# Patient Record
Sex: Female | Born: 1965 | Race: White | Hispanic: No | Marital: Married | Smoking: Current some day smoker
Health system: Southern US, Community
[De-identification: ages and names within clinical notes are randomized; demographics above are authoritative.]

## PROBLEM LIST (undated history)

## (undated) DIAGNOSIS — I10 Essential (primary) hypertension: Secondary | ICD-10-CM

## (undated) DIAGNOSIS — M719 Bursopathy, unspecified: Secondary | ICD-10-CM

## (undated) DIAGNOSIS — F32A Depression, unspecified: Secondary | ICD-10-CM

## (undated) DIAGNOSIS — M1990 Unspecified osteoarthritis, unspecified site: Secondary | ICD-10-CM

## (undated) DIAGNOSIS — F419 Anxiety disorder, unspecified: Secondary | ICD-10-CM

## (undated) DIAGNOSIS — M5030 Other cervical disc degeneration, unspecified cervical region: Secondary | ICD-10-CM

## (undated) DIAGNOSIS — F329 Major depressive disorder, single episode, unspecified: Secondary | ICD-10-CM

## (undated) DIAGNOSIS — M797 Fibromyalgia: Secondary | ICD-10-CM

## (undated) HISTORY — PX: ABDOMINAL HYSTERECTOMY: SHX81

## (undated) HISTORY — PX: ABDOMINAL SURGERY: SHX537

---

## 2003-12-22 ENCOUNTER — Emergency Department: Payer: Self-pay | Admitting: Emergency Medicine

## 2004-07-16 ENCOUNTER — Emergency Department: Payer: Self-pay | Admitting: Internal Medicine

## 2007-09-18 ENCOUNTER — Ambulatory Visit: Payer: Self-pay | Admitting: Unknown Physician Specialty

## 2007-10-04 ENCOUNTER — Ambulatory Visit: Payer: Self-pay | Admitting: Unknown Physician Specialty

## 2007-10-08 ENCOUNTER — Ambulatory Visit: Payer: Self-pay | Admitting: Unknown Physician Specialty

## 2007-10-16 ENCOUNTER — Ambulatory Visit: Payer: Self-pay | Admitting: Unknown Physician Specialty

## 2007-10-19 ENCOUNTER — Emergency Department: Payer: Self-pay | Admitting: Internal Medicine

## 2009-01-30 ENCOUNTER — Emergency Department: Payer: Self-pay | Admitting: Emergency Medicine

## 2009-04-16 ENCOUNTER — Ambulatory Visit: Payer: Self-pay | Admitting: Family Medicine

## 2010-02-07 ENCOUNTER — Emergency Department: Payer: Self-pay | Admitting: Unknown Physician Specialty

## 2010-12-06 ENCOUNTER — Encounter: Payer: Self-pay | Admitting: Rheumatology

## 2010-12-23 ENCOUNTER — Encounter: Payer: Self-pay | Admitting: Rheumatology

## 2011-01-22 ENCOUNTER — Encounter: Payer: Self-pay | Admitting: Rheumatology

## 2011-02-22 ENCOUNTER — Encounter: Payer: Self-pay | Admitting: Rheumatology

## 2011-03-25 ENCOUNTER — Encounter: Payer: Self-pay | Admitting: Rheumatology

## 2014-12-14 ENCOUNTER — Encounter: Payer: Self-pay | Admitting: Emergency Medicine

## 2014-12-14 ENCOUNTER — Emergency Department: Payer: Self-pay

## 2014-12-14 ENCOUNTER — Emergency Department
Admission: EM | Admit: 2014-12-14 | Discharge: 2014-12-14 | Disposition: A | Discharge status: Home / Self Care | Payer: Self-pay | Attending: Emergency Medicine | Admitting: Emergency Medicine

## 2014-12-14 DIAGNOSIS — K219 Gastro-esophageal reflux disease without esophagitis: Secondary | ICD-10-CM | POA: Insufficient documentation

## 2014-12-14 DIAGNOSIS — I10 Essential (primary) hypertension: Secondary | ICD-10-CM | POA: Insufficient documentation

## 2014-12-14 DIAGNOSIS — E669 Obesity, unspecified: Secondary | ICD-10-CM | POA: Insufficient documentation

## 2014-12-14 DIAGNOSIS — Z880 Allergy status to penicillin: Secondary | ICD-10-CM | POA: Insufficient documentation

## 2014-12-14 DIAGNOSIS — Z720 Tobacco use: Secondary | ICD-10-CM | POA: Insufficient documentation

## 2014-12-14 DIAGNOSIS — K2960 Other gastritis without bleeding: Secondary | ICD-10-CM

## 2014-12-14 HISTORY — DX: Unspecified osteoarthritis, unspecified site: M19.90

## 2014-12-14 HISTORY — DX: Essential (primary) hypertension: I10

## 2014-12-14 HISTORY — DX: Anxiety disorder, unspecified: F41.9

## 2014-12-14 LAB — COMPREHENSIVE METABOLIC PANEL
ALBUMIN: 4.2 g/dL (ref 3.5–5.0)
ALK PHOS: 107 U/L (ref 38–126)
ALT: 32 U/L (ref 14–54)
AST: 23 U/L (ref 15–41)
Anion gap: 8 (ref 5–15)
BILIRUBIN TOTAL: 0.5 mg/dL (ref 0.3–1.2)
BUN: 13 mg/dL (ref 6–20)
CALCIUM: 9.7 mg/dL (ref 8.9–10.3)
CO2: 28 mmol/L (ref 22–32)
CREATININE: 0.77 mg/dL (ref 0.44–1.00)
Chloride: 106 mmol/L (ref 101–111)
GFR calc Af Amer: >60 mL/min (ref >60)
GFR calc non Af Amer: >60 mL/min (ref >60)
GLUCOSE: 115 mg/dL — AB (ref 65–99)
Potassium: 3.7 mmol/L (ref 3.5–5.1)
SODIUM: 142 mmol/L (ref 135–145)
Total Protein: 7.8 g/dL (ref 6.5–8.1)

## 2014-12-14 LAB — CBC WITH DIFFERENTIAL/PLATELET
BASOS PCT: 1 %
Basophils Absolute: 0.1 10*3/uL (ref 0–0.1)
EOS PCT: 1 %
Eosinophils Absolute: 0.1 10*3/uL (ref 0–0.7)
HEMATOCRIT: 42.1 % (ref 35.0–47.0)
Hemoglobin: 14.3 g/dL (ref 12.0–16.0)
Lymphocytes Relative: 21 %
Lymphs Abs: 1.8 10*3/uL (ref 1.0–3.6)
MCH: 30.5 pg (ref 26.0–34.0)
MCHC: 33.9 g/dL (ref 32.0–36.0)
MCV: 90.1 fL (ref 80.0–100.0)
MONO ABS: 0.6 10*3/uL (ref 0.2–0.9)
MONOS PCT: 7 %
NEUTROS ABS: 6.2 10*3/uL (ref 1.4–6.5)
Neutrophils Relative %: 70 %
PLATELETS: 260 10*3/uL (ref 150–440)
RBC: 4.67 MIL/uL (ref 3.80–5.20)
RDW: 12.6 % (ref 11.5–14.5)
WBC: 8.8 10*3/uL (ref 3.6–11.0)

## 2014-12-14 LAB — LIPASE, BLOOD: Lipase: 18 U/L (ref 11–51)

## 2014-12-14 LAB — TROPONIN I

## 2014-12-14 MED ORDER — PANTOPRAZOLE SODIUM 20 MG PO TBEC: 20.0000 mg | DELAYED_RELEASE_TABLET | Freq: Every day | ORAL | Status: DC

## 2014-12-14 MED ORDER — GI COCKTAIL ~~LOC~~
30.0000 mL | Freq: Once | ORAL | Status: AC
  Administered 2014-12-14: 30 mL via ORAL
  Filled 2014-12-14: qty 30

## 2014-12-14 MED ORDER — CHLORTHALIDONE 25 MG PO TABS: 12.5000 mg | ORAL_TABLET | Freq: Every day | ORAL | Status: DC

## 2014-12-14 NOTE — ED Notes (Signed)
Pt presents with epigastric pain started on Friday, worse after she eats, states has had some vomiting. No acute distress noted, pt ambulatory to stat desk with no difficulty noted.

## 2014-12-14 NOTE — ED Provider Notes (Signed)
Time Seen: Approximately ----------------------------------------- 9:10 AM on 12/14/2014 -----------------------------------------    I have reviewed the triage notes  Chief Complaint: Abdominal Pain   History of Present Illness: Kelsey Malone is a 49 y.o. female who presents with epigastric pain. The patient does provide Fayrene FearingJames and is aware of previous transgender patient. The patient states on Friday they got some pain after eating a meal. Patient states he had some nausea and vomited without any blood or bile. He states the pain seems to have persisted. He does have a history of hypertension and has not been on any medications for several months. Patient denies any history of diabetes or high cholesterol that he is aware of. He does have a history of smoking and anxiety. Patient denies any fever, chills, productive cough. Patient describes the pain as burning and the nausea occurs after meals. Patient denies any abdominal pain in the right upper quadrant or any intrascapular pain.   Past Medical History  Diagnosis Date  . Hypertension   . Arthritis   . Anxiety     There are no active problems to display for this patient.   Past Surgical History  Procedure Laterality Date  . Abdominal surgery    . Abdominal hysterectomy      Past Surgical History  Procedure Laterality Date  . Abdominal surgery    . Abdominal hysterectomy      No current outpatient prescriptions on file.  Allergies:  Penicillins  Family History: No family history on file.  Social History: Social History  Substance Use Topics  . Smoking status: Current Some Day Smoker  . Smokeless tobacco: None  . Alcohol Use: Yes     Review of Systems:   10 point review of systems was performed and was otherwise negative:  Constitutional: No fever Eyes: No visual disturbances ENT: No sore throat, ear pain Cardiac: No chest pain at present Respiratory: No shortness of breath, wheezing, or  stridor Abdomen: No abdominal pain, no vomiting, No diarrhea Endocrine: No weight loss, No night sweats Extremities: No peripheral edema, cyanosis Skin: No rashes, easy bruising Neurologic: No focal weakness, trouble with speech or swollowing Urologic: No dysuria, Hematuria, or urinary frequency   Physical Exam:  ED Triage Vitals  Enc Vitals Group     BP 12/14/14 0842 166/112 mmHg     Pulse Rate 12/14/14 0842 99     Resp 12/14/14 0842 20     Temp 12/14/14 0843 97.8 F (36.6 C)     Temp Source 12/14/14 0843 Oral     SpO2 12/14/14 0842 97 %     Weight --      Height 12/14/14 0839 5\' 6"  (1.676 m)     Head Cir --      Peak Flow --      Pain Score 12/14/14 0840 5     Pain Loc --      Pain Edu? --      Excl. in GC? --     General: Awake , Alert , and Oriented times 3; GCS 15 Head: Normal cephalic , atraumatic Eyes: Pupils equal , round, reactive to light Nose/Throat: No nasal drainage, patent upper airway without erythema or exudate.  Neck: Supple, Full range of motion, No anterior adenopathy or palpable thyroid masses Lungs: Clear to ascultation without wheezes , rhonchi, or rales Heart: Regular rate, regular rhythm without murmurs , gallops , or rubs Abdomen: Obese Soft, non tender without rebound, guarding , or rigidity; bowel sounds positive and  symmetric in all 4 quadrants. No organomegaly .        Extremities: 2 plus symmetric pulses. No edema, clubbing or cyanosis Neurologic: normal ambulation, Motor symmetric without deficits, sensory intact Skin: warm, dry, no rashes No reproducible chest wall pain.  Labs:   All laboratory work was reviewed including any pertinent negatives or positives listed below:  Labs Reviewed  COMPREHENSIVE METABOLIC PANEL  CBC WITH DIFFERENTIAL/PLATELET  LIPASE, BLOOD  TROPONIN I    EKG:  ED ECG REPORT I, Jennye Moccasin, the attending physician, personally viewed and interpreted this ECG.  Date: 12/14/2014 EKG Time: 0842 Rate:  98 Rhythm: normal sinus rhythm QRS Axis: normal Intervals: normal ST/T Wave abnormalities: normal Conduction Disutrbances: none Narrative Interpretation: unremarkable    Radiology:     EXAM: CHEST 2 VIEW  COMPARISON: None.  FINDINGS: Normal heart size and vascularity. No focal pneumonia, collapse or consolidation. Negative for edema, effusion or pneumothorax. Trachea is midline. Mild scoliosis of the spine.  IMPRESSION: No acute chest process.    I personally reviewed the radiologic studies     ED Course:  Patient's stay here was uneventful and the patient remained hemodynamically stable. Patient was given a GI cocktail with symptomatic improvement.Differential includes all life-threatening causes for chest pain. This includes but is not exclusive to acute coronary syndrome, aortic dissection, pulmonary embolism, cardiac tamponade, community-acquired pneumonia, pericarditis, musculoskeletal chest wall pain, etc. Given the patient's current clinical presentation and objective findings I felt most likely this was gastrointestinal in nature. Possible gastritis versus esophageal reflux disease. Patient's also been out of his blood pressure medication he was prescribed this along with prescription for protonix  and advised he can substitute protonix with over-the-counter Nexium or Prilosec. Tylenol for pain and to avoid nonsteroidal products and decrease his smoking.    Assessment:  Gastritis versus esophageal reflux disease     Plan:  Outpatient management Patient was advised to return immediately if condition worsens. Patient was advised to follow up with her primary care physician or other specialized physicians involved and in their current assessment.             Jennye Moccasin, MD 12/14/14 [phone]

## 2014-12-14 NOTE — Discharge Instructions (Signed)
Gastritis, Adult °Gastritis is soreness and swelling (inflammation) of the lining of the stomach. Gastritis can develop as a sudden onset (acute) or long-term (chronic) condition. If gastritis is not treated, it can lead to stomach bleeding and ulcers. °CAUSES  °Gastritis occurs when the stomach lining is weak or damaged. Digestive juices from the stomach then inflame the weakened stomach lining. The stomach lining may be weak or damaged due to viral or bacterial infections. One common bacterial infection is the Helicobacter pylori infection. Gastritis can also result from excessive alcohol consumption, taking certain medicines, or having too much acid in the stomach.  °SYMPTOMS  °In some cases, there are no symptoms. When symptoms are present, they may include: °· Pain or a burning sensation in the upper abdomen. °· Nausea. °· Vomiting. °· An uncomfortable feeling of fullness after eating. °DIAGNOSIS  °Your caregiver may suspect you have gastritis based on your symptoms and a physical exam. To determine the cause of your gastritis, your caregiver may perform the following: °· Blood or stool tests to check for the H pylori bacterium. °· Gastroscopy. A thin, flexible tube (endoscope) is passed down the esophagus and into the stomach. The endoscope has a light and camera on the end. Your caregiver uses the endoscope to view the inside of the stomach. °· Taking a tissue sample (biopsy) from the stomach to examine under a microscope. °TREATMENT  °Depending on the cause of your gastritis, medicines may be prescribed. If you have a bacterial infection, such as an H pylori infection, antibiotics may be given. If your gastritis is caused by too much acid in the stomach, H2 blockers or antacids may be given. Your caregiver may recommend that you stop taking aspirin, ibuprofen, or other nonsteroidal anti-inflammatory drugs (NSAIDs). °HOME CARE INSTRUCTIONS °· Only take over-the-counter or prescription medicines as directed by  your caregiver. °· If you were given antibiotic medicines, take them as directed. Finish them even if you start to feel better. °· Drink enough fluids to keep your urine clear or pale yellow. °· Avoid foods and drinks that make your symptoms worse, such as: °¨ Caffeine or alcoholic drinks. °¨ Chocolate. °¨ Peppermint or mint flavorings. °¨ Garlic and onions. °¨ Spicy foods. °¨ Citrus fruits, such as oranges, lemons, or limes. °¨ Tomato-based foods such as sauce, chili, salsa, and pizza. °¨ Fried and fatty foods. °· Eat small, frequent meals instead of large meals. °SEEK IMMEDIATE MEDICAL CARE IF:  °· You have black or dark red stools. °· You vomit blood or material that looks like coffee grounds. °· You are unable to keep fluids down. °· Your abdominal pain gets worse. °· You have a fever. °· You do not feel better after 1 week. °· You have any other questions or concerns. °MAKE SURE YOU: °· Understand these instructions. °· Will watch your condition. °· Will get help right away if you are not doing well or get worse. °  °This information is not intended to replace advice given to you by your health care provider. Make sure you discuss any questions you have with your health care provider. °  °Document Released: 02/01/2001 Document Revised: 08/09/2011 Document Reviewed: 03/23/2011 °Elsevier Interactive Patient Education ©2016 [employer] ° ° °Please return immediately if condition worsens. Please contact her primary physician or the physician you were given for referral. If you have any specialist physicians involved in her treatment and plan please also contact them. Thank you for using [facility]. ° °

## 2015-04-08 ENCOUNTER — Encounter: Payer: Self-pay | Admitting: *Deleted

## 2015-04-08 ENCOUNTER — Emergency Department
Admission: EM | Admit: 2015-04-08 | Discharge: 2015-04-08 | Disposition: A | Discharge status: Home / Self Care | Payer: Self-pay | Attending: Emergency Medicine | Admitting: Emergency Medicine

## 2015-04-08 DIAGNOSIS — Z880 Allergy status to penicillin: Secondary | ICD-10-CM | POA: Insufficient documentation

## 2015-04-08 DIAGNOSIS — R197 Diarrhea, unspecified: Secondary | ICD-10-CM | POA: Insufficient documentation

## 2015-04-08 DIAGNOSIS — R1110 Vomiting, unspecified: Secondary | ICD-10-CM

## 2015-04-08 DIAGNOSIS — F17200 Nicotine dependence, unspecified, uncomplicated: Secondary | ICD-10-CM | POA: Insufficient documentation

## 2015-04-08 DIAGNOSIS — I10 Essential (primary) hypertension: Secondary | ICD-10-CM | POA: Insufficient documentation

## 2015-04-08 DIAGNOSIS — N390 Urinary tract infection, site not specified: Secondary | ICD-10-CM | POA: Insufficient documentation

## 2015-04-08 DIAGNOSIS — Z79899 Other long term (current) drug therapy: Secondary | ICD-10-CM | POA: Insufficient documentation

## 2015-04-08 DIAGNOSIS — R112 Nausea with vomiting, unspecified: Secondary | ICD-10-CM | POA: Insufficient documentation

## 2015-04-08 LAB — CBC
HEMATOCRIT: 45 % (ref 35.0–47.0)
HEMOGLOBIN: 15.2 g/dL (ref 12.0–16.0)
MCH: 30.3 pg (ref 26.0–34.0)
MCHC: 33.9 g/dL (ref 32.0–36.0)
MCV: 89.6 fL (ref 80.0–100.0)
Platelets: 265 10*3/uL (ref 150–440)
RBC: 5.02 MIL/uL (ref 3.80–5.20)
RDW: 12.8 % (ref 11.5–14.5)
WBC: 18.7 10*3/uL — ABNORMAL HIGH (ref 3.6–11.0)

## 2015-04-08 LAB — COMPREHENSIVE METABOLIC PANEL
ALK PHOS: 94 U/L (ref 38–126)
ALT: 49 U/L (ref 14–54)
ANION GAP: 10 (ref 5–15)
AST: 29 U/L (ref 15–41)
Albumin: 4.7 g/dL (ref 3.5–5.0)
BUN: 22 mg/dL — ABNORMAL HIGH (ref 6–20)
CALCIUM: 9.5 mg/dL (ref 8.9–10.3)
CO2: 28 mmol/L (ref 22–32)
Chloride: 103 mmol/L (ref 101–111)
Creatinine, Ser: 0.79 mg/dL (ref 0.44–1.00)
GLUCOSE: 151 mg/dL — AB (ref 65–99)
Potassium: 3.7 mmol/L (ref 3.5–5.1)
SODIUM: 141 mmol/L (ref 135–145)
Total Bilirubin: 0.9 mg/dL (ref 0.3–1.2)
Total Protein: 8.3 g/dL — ABNORMAL HIGH (ref 6.5–8.1)

## 2015-04-08 LAB — URINALYSIS COMPLETE WITH MICROSCOPIC (ARMC ONLY)
Bilirubin Urine: NEGATIVE
Glucose, UA: NEGATIVE mg/dL
Ketones, ur: NEGATIVE mg/dL
Nitrite: POSITIVE — AB
PROTEIN: 30 mg/dL — AB
Specific Gravity, Urine: 1.028 (ref 1.005–1.030)
pH: 5 (ref 5.0–8.0)

## 2015-04-08 LAB — LIPASE, BLOOD: LIPASE: 14 U/L (ref 11–51)

## 2015-04-08 MED ORDER — ONDANSETRON HCL 4 MG/2ML IJ SOLN
4.0000 mg | Freq: Once | INTRAMUSCULAR | Status: AC
  Administered 2015-04-08: 4 mg via INTRAVENOUS
  Filled 2015-04-08: qty 2

## 2015-04-08 MED ORDER — CIPROFLOXACIN IN D5W 400 MG/200ML IV SOLN
400.0000 mg | Freq: Two times a day (BID) | INTRAVENOUS | Status: DC
  Administered 2015-04-08: 400 mg via INTRAVENOUS
  Filled 2015-04-08: qty 200

## 2015-04-08 MED ORDER — ONDANSETRON 4 MG PO TBDP: 4.0000 mg | ORAL_TABLET | Freq: Three times a day (TID) | ORAL | Status: DC | PRN

## 2015-04-08 MED ORDER — CIPROFLOXACIN HCL 500 MG PO TABS: 500.0000 mg | ORAL_TABLET | Freq: Two times a day (BID) | ORAL | Status: AC

## 2015-04-08 MED ORDER — SODIUM CHLORIDE 0.9 % IV BOLUS (SEPSIS)
1000.0000 mL | Freq: Once | INTRAVENOUS | Status: AC
  Administered 2015-04-08: 1000 mL via INTRAVENOUS

## 2015-04-08 NOTE — Discharge Instructions (Signed)
Diarrhea °Diarrhea is frequent loose and watery bowel movements. It can cause you to feel weak and dehydrated. Dehydration can cause you to become tired and thirsty, have a dry mouth, and have decreased urination that often is dark yellow. Diarrhea is a sign of another problem, most often an infection that will not last long. In most cases, diarrhea typically lasts 2-3 days. However, it can last longer if it is a sign of something more serious. It is important to treat your diarrhea as directed by your caregiver to lessen or prevent future episodes of diarrhea. °CAUSES  °Some common causes include: °· Gastrointestinal infections caused by viruses, bacteria, or parasites. °· Food poisoning or food allergies. °· Certain medicines, such as antibiotics, chemotherapy, and laxatives. °· Artificial sweeteners and fructose. °· Digestive disorders. °HOME CARE INSTRUCTIONS °· Ensure adequate fluid intake (hydration): Have 1 cup (8 oz) of fluid for each diarrhea episode. Avoid fluids that contain simple sugars or sports drinks, fruit juices, whole milk products, and sodas. Your urine should be clear or pale yellow if you are drinking enough fluids. Hydrate with an oral rehydration solution that you can purchase at pharmacies, retail stores, and online. You can prepare an oral rehydration solution at home by mixing the following ingredients together: °·  - tsp table salt. °· ¾ tsp baking soda. °·  tsp salt substitute containing potassium chloride. °· 1  tablespoons sugar. °· 1 L (34 oz) of water. °· Certain foods and beverages may increase the speed at which food moves through the gastrointestinal (GI) tract. These foods and beverages should be avoided and include: °· Caffeinated and alcoholic beverages. °· High-fiber foods, such as raw fruits and vegetables, nuts, seeds, and whole grain breads and cereals. °· Foods and beverages sweetened with sugar alcohols, such as xylitol, sorbitol, and mannitol. °· Some foods may be well  tolerated and may help thicken stool including: °· Starchy foods, such as rice, toast, pasta, low-sugar cereal, oatmeal, grits, baked potatoes, crackers, and bagels. °· Bananas. °· Applesauce. °· Add probiotic-rich foods to help increase healthy bacteria in the GI tract, such as yogurt and fermented milk products. °· Wash your hands well after each diarrhea episode. °· Only take over-the-counter or prescription medicines as directed by your caregiver. °· Take a warm bath to relieve any burning or pain from frequent diarrhea episodes. °SEEK IMMEDIATE MEDICAL CARE IF:  °· You are unable to keep fluids down. °· You have persistent vomiting. °· You have blood in your stool, or your stools are black and tarry. °· You do not urinate in 6-8 hours, or there is only a small amount of very dark urine. °· You have abdominal pain that increases or localizes. °· You have weakness, dizziness, confusion, or light-headedness. °· You have a severe headache. °· Your diarrhea gets worse or does not get better. °· You have a fever or persistent symptoms for more than 2-3 days. °· You have a fever and your symptoms suddenly get worse. °MAKE SURE YOU:  °· Understand these instructions. °· Will watch your condition. °· Will get help right away if you are not doing well or get worse. °  °This information is not intended to replace advice given to you by your health care provider. Make sure you discuss any questions you have with your health care provider. °  °Document Released: 01/28/2002 Document Revised: 02/28/2014 Document Reviewed: 10/16/2011 °Elsevier Interactive Patient Education ©2016 [employer] ° °Nausea and Vomiting °Nausea is a sick feeling that often comes   before throwing up (vomiting). Vomiting is a reflex where stomach contents come out of your mouth. Vomiting can cause severe loss of body fluids (dehydration). Children and elderly adults can become dehydrated quickly, especially if they also have diarrhea. Nausea and  vomiting are symptoms of a condition or disease. It is important to find the cause of your symptoms. °CAUSES  °· Direct irritation of the stomach lining. This irritation can result from increased acid production (gastroesophageal reflux disease), infection, food poisoning, taking certain medicines (such as nonsteroidal anti-inflammatory drugs), alcohol use, or tobacco use. °· Signals from the brain. These signals could be caused by a headache, heat exposure, an inner ear disturbance, increased pressure in the brain from injury, infection, a tumor, or a concussion, pain, emotional stimulus, or metabolic problems. °· An obstruction in the gastrointestinal tract (bowel obstruction). °· Illnesses such as diabetes, hepatitis, gallbladder problems, appendicitis, kidney problems, cancer, sepsis, atypical symptoms of a heart attack, or eating disorders. °· Medical treatments such as chemotherapy and radiation. °· Receiving medicine that makes you sleep (general anesthetic) during surgery. °DIAGNOSIS °Your caregiver may ask for tests to be done if the problems do not improve after a few days. Tests may also be done if symptoms are severe or if the reason for the nausea and vomiting is not clear. Tests may include: °· Urine tests. °· Blood tests. °· Stool tests. °· Cultures (to look for evidence of infection). °· X-rays or other imaging studies. °Test results can help your caregiver make decisions about treatment or the need for additional tests. °TREATMENT °You need to stay well hydrated. Drink frequently but in small amounts. You may wish to drink water, sports drinks, clear broth, or eat frozen ice pops or gelatin dessert to help stay hydrated. When you eat, eating slowly may help prevent nausea. There are also some antinausea medicines that may help prevent nausea. °HOME CARE INSTRUCTIONS  °· Take all medicine as directed by your caregiver. °· If you do not have an appetite, do not force yourself to eat. However, you must  continue to drink fluids. °· If you have an appetite, eat a normal diet unless your caregiver tells you differently. °· Eat a variety of complex carbohydrates (rice, wheat, potatoes, bread), lean meats, yogurt, fruits, and vegetables. °· Avoid high-fat foods because they are more difficult to digest. °· Drink enough water and fluids to keep your urine clear or pale yellow. °· If you are dehydrated, ask your caregiver for specific rehydration instructions. Signs of dehydration may include: °· Severe thirst. °· Dry lips and mouth. °· Dizziness. °· Dark urine. °· Decreasing urine frequency and amount. °· Confusion. °· Rapid breathing or pulse. °SEEK IMMEDIATE MEDICAL CARE IF:  °· You have blood or brown flecks (like coffee grounds) in your vomit. °· You have black or bloody stools. °· You have a severe headache or stiff neck. °· You are confused. °· You have severe abdominal pain. °· You have chest pain or trouble breathing. °· You do not urinate at least once every 8 hours. °· You develop cold or clammy skin. °· You continue to vomit for longer than 24 to 48 hours. °· You have a fever. °MAKE SURE YOU:  °· Understand these instructions. °· Will watch your condition. °· Will get help right away if you are not doing well or get worse. °  °This information is not intended to replace advice given to you by your health care provider. Make sure you discuss any questions you have with your health care   provider. °  °Document Released: 02/07/2005 Document Revised: 05/02/2011 Document Reviewed: 07/07/2010 °Elsevier Interactive Patient Education ©2016 [employer] ° °Urinary Tract Infection °Urinary tract infections (UTIs) can develop anywhere along your urinary tract. Your urinary tract is your body's drainage system for removing wastes and extra water. Your urinary tract includes two kidneys, two ureters, a bladder, and a urethra. Your kidneys are a pair of bean-shaped organs. Each kidney is about the size of your fist. They  are located below your ribs, one on each side of your spine. °CAUSES °Infections are caused by microbes, which are microscopic organisms, including fungi, viruses, and bacteria. These organisms are so small that they can only be seen through a microscope. Bacteria are the microbes that most commonly cause UTIs. °SYMPTOMS  °Symptoms of UTIs may vary by age and gender of the patient and by the location of the infection. Symptoms in young women typically include a frequent and intense urge to urinate and a painful, burning feeling in the bladder or urethra during urination. Older women and men are more likely to be tired, shaky, and weak and have muscle aches and abdominal pain. A fever may mean the infection is in your kidneys. Other symptoms of a kidney infection include pain in your back or sides below the ribs, nausea, and vomiting. °DIAGNOSIS °To diagnose a UTI, your caregiver will ask you about your symptoms. Your caregiver will also ask you to provide a urine sample. The urine sample will be tested for bacteria and white blood cells. White blood cells are made by your body to help fight infection. °TREATMENT  °Typically, UTIs can be treated with medication. Because most UTIs are caused by a bacterial infection, they usually can be treated with the use of antibiotics. The choice of antibiotic and length of treatment depend on your symptoms and the type of bacteria causing your infection. °HOME CARE INSTRUCTIONS °· If you were prescribed antibiotics, take them exactly as your caregiver instructs you. Finish the medication even if you feel better after you have only taken some of the medication. °· Drink enough water and fluids to keep your urine clear or pale yellow. °· Avoid caffeine, tea, and carbonated beverages. They tend to irritate your bladder. °· Empty your bladder often. Avoid holding urine for long periods of time. °· Empty your bladder before and after sexual intercourse. °· After a bowel movement,  women should cleanse from front to back. Use each tissue only once. °SEEK MEDICAL CARE IF:  °· You have back pain. °· You develop a fever. °· Your symptoms do not begin to resolve within 3 days. °SEEK IMMEDIATE MEDICAL CARE IF:  °· You have severe back pain or lower abdominal pain. °· You develop chills. °· You have nausea or vomiting. °· You have continued burning or discomfort with urination. °MAKE SURE YOU:  °· Understand these instructions. °· Will watch your condition. °· Will get help right away if you are not doing well or get worse. °  °This information is not intended to replace advice given to you by your health care provider. Make sure you discuss any questions you have with your health care provider. °  °Document Released: 11/17/2004 Document Revised: 10/29/2014 Document Reviewed: 03/18/2011 °Elsevier Interactive Patient Education ©2016 [employer] ° °

## 2015-04-08 NOTE — ED Provider Notes (Signed)
[facility] Emergency Department Provider Note  ____________________________________________  Time seen: Approximately 356 AM  I have reviewed the triage vital signs and the nursing notes.   HISTORY  Chief Complaint Emesis and Diarrhea    HPI Kelsey Malone is a 50 y.o. female who comes into the hospital today with abdominal pain vomiting and diarrhea. The patient reports that the last time she vomited it was dry heaves. The patient initially thought she had food poisoning but then found out that her son's girlfriend had nor a virus 2 weeks ago. The patient's symptoms started around 8 PM. She reports that she's had pain all over her abdomen is about a 5 out of 10 in intensity. The patient reports laying flat it is not that bad but when she sits up the pain seems worse. The patient's had some cough and chills and her forehead did feel slightly warm. The patient reports that she has back pain but that is a chronic symptom for her. The patient has not had any burning with urination or any other complaints at this time.   Past Medical History  Diagnosis Date  . Hypertension   . Arthritis   . Anxiety     There are no active problems to display for this patient.   Past Surgical History  Procedure Laterality Date  . Abdominal surgery    . Abdominal hysterectomy      Current Outpatient Rx  Name  Route  Sig  Dispense  Refill  . acetaminophen (TYLENOL) 325 MG tablet   Oral   Take 650 mg by mouth every 6 (six) hours as needed for moderate pain, fever or headache.         . chlorthalidone (HYGROTON) 25 MG tablet   Oral   Take 0.5 tablets (12.5 mg total) by mouth daily.   30 tablet   1   . ranitidine (ZANTAC) 150 MG tablet   Oral   Take 150 mg by mouth daily.         . ciprofloxacin (CIPRO) 500 MG tablet   Oral   Take 1 tablet (500 mg total) by mouth 2 (two) times daily.   20 tablet   0   . ondansetron (ZOFRAN ODT) 4 MG disintegrating  tablet   Oral   Take 1 tablet (4 mg total) by mouth every 8 (eight) hours as needed for nausea or vomiting.   20 tablet   0   . pantoprazole (PROTONIX) 20 MG tablet   Oral   Take 1 tablet (20 mg total) by mouth daily. Patient not taking: Reported on 04/08/2015   30 tablet   1     Allergies Opium and Penicillins  No family history on file.  Social History Social History  Substance Use Topics  . Smoking status: Current Some Day Smoker  . Smokeless tobacco: None  . Alcohol Use: Yes    Review of Systems Constitutional: Chills Eyes: No visual changes. ENT: No sore throat. Cardiovascular: Denies chest pain. Respiratory: Denies shortness of breath. Gastrointestinal:  abdominal pain, nausea, vomiting, diarrhea.  No constipation. Genitourinary: Negative for dysuria. Musculoskeletal: Negative for back pain. Skin: Negative for rash. Neurological: Negative for headaches, focal weakness or numbness.  10-point ROS otherwise negative.  ____________________________________________   PHYSICAL EXAM:  VITAL SIGNS: ED Triage Vitals  Enc Vitals Group     BP 04/08/15 0206 122/79 mmHg     Pulse Rate 04/08/15 0206 118     Resp 04/08/15 0206 20  Temp 04/08/15 0206 97.7 F (36.5 C)     Temp Source 04/08/15 0206 Oral     SpO2 04/08/15 0206 100 %     Weight 04/08/15 0206 250 lb (113.399 kg)     Height 04/08/15 0206  (1.702 m)     Head Cir --      Peak Flow --      Pain Score 04/08/15 0207 8     Pain Loc --      Pain Edu? --      Excl. in GC? --     Constitutional: Alert and oriented. Well appearing and in mild distress. Eyes: Conjunctivae are normal. PERRL. EOMI. Head: Atraumatic. Nose: No congestion/rhinnorhea. Mouth/Throat: Mucous membranes are moist.  Oropharynx non-erythematous. Cardiovascular: Normal rate, regular rhythm. Grossly normal heart sounds.  Good peripheral circulation. Respiratory: Normal respiratory effort.  No retractions. Lungs  CTAB. Gastrointestinal: Soft with diffuse abdominal pain. No distention. Positive bowel sounds Musculoskeletal: No lower extremity tenderness nor edema.   Neurologic:  Normal speech and language. Skin:  Skin is warm, dry and intact. Marland Kitchen Psychiatric: Mood and affect are normal.   ____________________________________________   LABS (all labs ordered are listed, but only abnormal results are displayed)  Labs Reviewed  COMPREHENSIVE METABOLIC PANEL - Abnormal; Notable for the following:    Glucose, Bld 151 (*)    BUN 22 (*)    Total Protein 8.3 (*)    All other components within normal limits  CBC - Abnormal; Notable for the following:    WBC 18.7 (*)    All other components within normal limits  URINALYSIS COMPLETEWITH MICROSCOPIC ([facility] ONLY) - Abnormal; Notable for the following:    Color, Urine AMBER (*)    APPearance CLOUDY (*)    Hgb urine dipstick 1+ (*)    Protein, ur 30 (*)    Nitrite POSITIVE (*)    Leukocytes, UA TRACE (*)    Bacteria, UA MANY (*)    Squamous Epithelial / LPF 6-30 (*)    All other components within normal limits  URINE CULTURE  LIPASE, BLOOD   ____________________________________________  EKG  None ____________________________________________  RADIOLOGY  None ____________________________________________   PROCEDURES  Procedure(s) performed: None  Critical Care performed: No  ____________________________________________   INITIAL IMPRESSION / ASSESSMENT AND PLAN / ED COURSE  Pertinent labs & imaging results that were available during my care of the patient were reviewed by me and considered in my medical decision making (see chart for details).  This is a 49 year old transgender female who comes into the hospital today with abdominal pain vomiting and diarrhea. The patient has had a contact with normal iris but does also appear to have a UTI. I did give the patient liter of normal saline as well as a dose of ciprofloxacin as she is  allergic to penicillins. The patient has had some tongue and throat swelling with the penicillins I did not think that ceftriaxone would be appropriate. After giving some oral Zofran the patient was able to take some ice chips as well as drink some juice. She will be discharged home with some Zofran as well as some antibiotics. I did send a urine culture as well. ____________________________________________   FINAL CLINICAL IMPRESSION(S) / ED DIAGNOSES  Final diagnoses:  Vomiting and diarrhea  UTI (lower urinary tract infection)      Rebecka Apley, MD 04/08/15 [phone]

## 2015-04-08 NOTE — ED Notes (Addendum)
Pt to triage via wheelchair.  Pt has vomiting and diarrhea after eating out of date bread tonight.  Pt reports other people got sick too after eating the bread.  Vomited x 3 tonight.  Pt alert.

## 2015-04-11 LAB — URINE CULTURE

## 2015-09-12 ENCOUNTER — Emergency Department: Payer: Self-pay

## 2015-09-12 ENCOUNTER — Emergency Department
Admission: EM | Admit: 2015-09-12 | Discharge: 2015-09-12 | Disposition: A | Discharge status: Home / Self Care | Payer: Self-pay | Attending: Emergency Medicine | Admitting: Emergency Medicine

## 2015-09-12 DIAGNOSIS — R110 Nausea: Secondary | ICD-10-CM

## 2015-09-12 DIAGNOSIS — F17200 Nicotine dependence, unspecified, uncomplicated: Secondary | ICD-10-CM | POA: Insufficient documentation

## 2015-09-12 DIAGNOSIS — I10 Essential (primary) hypertension: Secondary | ICD-10-CM | POA: Insufficient documentation

## 2015-09-12 DIAGNOSIS — R42 Dizziness and giddiness: Secondary | ICD-10-CM | POA: Insufficient documentation

## 2015-09-12 LAB — CBC WITH DIFFERENTIAL/PLATELET
BASOS ABS: 0.1 10*3/uL (ref 0–0.1)
Basophils Relative: 1 %
Eosinophils Absolute: 0.1 10*3/uL (ref 0–0.7)
Eosinophils Relative: 2 %
HEMATOCRIT: 41 % (ref 35.0–47.0)
Hemoglobin: 14.2 g/dL (ref 12.0–16.0)
LYMPHS ABS: 2.5 10*3/uL (ref 1.0–3.6)
LYMPHS PCT: 32 %
MCH: 31.1 pg (ref 26.0–34.0)
MCHC: 34.6 g/dL (ref 32.0–36.0)
MCV: 89.7 fL (ref 80.0–100.0)
MONO ABS: 0.6 10*3/uL (ref 0.2–0.9)
Monocytes Relative: 8 %
NEUTROS ABS: 4.4 10*3/uL (ref 1.4–6.5)
Neutrophils Relative %: 57 %
Platelets: 244 10*3/uL (ref 150–440)
RBC: 4.57 MIL/uL (ref 3.80–5.20)
RDW: 12.5 % (ref 11.5–14.5)
WBC: 7.7 10*3/uL (ref 3.6–11.0)

## 2015-09-12 LAB — COMPREHENSIVE METABOLIC PANEL
ALT: 42 U/L (ref 14–54)
ANION GAP: 9 (ref 5–15)
AST: 27 U/L (ref 15–41)
Albumin: 4 g/dL (ref 3.5–5.0)
Alkaline Phosphatase: 84 U/L (ref 38–126)
BUN: 13 mg/dL (ref 6–20)
CALCIUM: 9.1 mg/dL (ref 8.9–10.3)
CHLORIDE: 108 mmol/L (ref 101–111)
CO2: 22 mmol/L (ref 22–32)
CREATININE: 0.68 mg/dL (ref 0.44–1.00)
GFR calc non Af Amer: >60 mL/min (ref >60)
GLUCOSE: 129 mg/dL — AB (ref 65–99)
Potassium: 3.5 mmol/L (ref 3.5–5.1)
SODIUM: 139 mmol/L (ref 135–145)
Total Bilirubin: 0.7 mg/dL (ref 0.3–1.2)
Total Protein: 7.5 g/dL (ref 6.5–8.1)

## 2015-09-12 LAB — SEDIMENTATION RATE: SED RATE: 24 mm/h (ref 0–30)

## 2015-09-12 LAB — BRAIN NATRIURETIC PEPTIDE: B Natriuretic Peptide: 22 pg/mL (ref 0.0–100.0)

## 2015-09-12 LAB — CK: Total CK: 58 U/L (ref 38–234)

## 2015-09-12 LAB — TROPONIN I

## 2015-09-12 MED ORDER — MECLIZINE HCL 25 MG PO TABS
ORAL_TABLET | ORAL | Status: AC
  Administered 2015-09-12: 50 mg via ORAL
  Filled 2015-09-12: qty 2

## 2015-09-12 MED ORDER — ONDANSETRON HCL 4 MG/2ML IJ SOLN: 4.0000 mg | Freq: Once | INTRAMUSCULAR | Status: DC | PRN

## 2015-09-12 MED ORDER — ONDANSETRON 4 MG PO TBDP: 4.0000 mg | ORAL_TABLET | Freq: Once | ORAL | Status: DC

## 2015-09-12 MED ORDER — ONDANSETRON 4 MG PO TBDP: 4.0000 mg | ORAL_TABLET | Freq: Three times a day (TID) | ORAL | Status: DC | PRN

## 2015-09-12 MED ORDER — MECLIZINE HCL 32 MG PO TABS: 32.0000 mg | ORAL_TABLET | Freq: Three times a day (TID) | ORAL | Status: DC | PRN

## 2015-09-12 MED ORDER — MECLIZINE HCL 25 MG PO TABS
50.0000 mg | ORAL_TABLET | Freq: Once | ORAL | Status: AC
  Administered 2015-09-12: 50 mg via ORAL

## 2015-09-12 MED ORDER — ONDANSETRON HCL 4 MG/2ML IJ SOLN
4.0000 mg | Freq: Once | INTRAMUSCULAR | Status: AC | PRN
  Administered 2015-09-12: 4 mg via INTRAVENOUS
  Filled 2015-09-12: qty 2

## 2015-09-12 NOTE — ED Provider Notes (Signed)
[facility] Emergency Department Provider Note   ____________________________________________  Time seen: Approximately 8:28 AM  I have reviewed the triage vital signs and the nursing notes.   HISTORY  Chief Complaint Nausea    HPI Kelsey Malone is a 50 y.o. female who was stung by yellow jackets 4 days ago. At least 5 or 6 stings. Still feels nauseated and just doesn't feel well. No chest pain or shortness of breath or swelling or anything doesn't feel well. Patient says not taking her antihypertensive medicine because he had money to go see the doctor. They have a list of free and reduced cost clinics in the area.   Past Medical History  Diagnosis Date  . Hypertension   . Arthritis   . Anxiety     There are no active problems to display for this patient.   Past Surgical History  Procedure Laterality Date  . Abdominal surgery    . Abdominal hysterectomy      Current Outpatient Rx  Name  Route  Sig  Dispense  Refill  . acetaminophen (TYLENOL) 325 MG tablet   Oral   Take 650 mg by mouth every 6 (six) hours as needed for moderate pain, fever or headache.         . chlorthalidone (HYGROTON) 25 MG tablet   Oral   Take 0.5 tablets (12.5 mg total) by mouth daily.   30 tablet   1   . meclizine (ANTIVERT) 32 MG tablet   Oral   Take 1 tablet (32 mg total) by mouth 3 (three) times daily as needed.   30 tablet   0   . ondansetron (ZOFRAN ODT) 4 MG disintegrating tablet   Oral   Take 1 tablet (4 mg total) by mouth every 8 (eight) hours as needed for nausea or vomiting.   20 tablet   0   . ondansetron (ZOFRAN ODT) 4 MG disintegrating tablet   Oral   Take 1 tablet (4 mg total) by mouth every 8 (eight) hours as needed for nausea or vomiting.   9 tablet   0   . ondansetron (ZOFRAN) 4 MG/2ML SOLN injection   Intravenous   Inject 2 mLs (4 mg total) into the vein once as needed for nausea.   2 mL   0   . ondansetron (ZOFRAN-ODT) 4  MG disintegrating tablet   Oral   Take 1 tablet (4 mg total) by mouth once.   20 tablet   0   . pantoprazole (PROTONIX) 20 MG tablet   Oral   Take 1 tablet (20 mg total) by mouth daily. Patient not taking: Reported on 04/08/2015   30 tablet   1   . ranitidine (ZANTAC) 150 MG tablet   Oral   Take 150 mg by mouth daily.           Allergies Opium and Penicillins  No family history on file.  Social History Social History  Substance Use Topics  . Smoking status: Current Some Day Smoker  . Smokeless tobacco: Not on file  . Alcohol Use: Yes    Review of Systems Constitutional: No fever/chills Eyes: No visual changes. ENT: No sore throat. Cardiovascular: Denies chest pain. Respiratory: Denies shortness of breath. Gastrointestinal: No abdominal pain.  No nausea, no vomiting.  No diarrhea.  No constipation. Genitourinary: Negative for dysuria. Musculoskeletal: Negative for back pain. Skin: Negative for rash. Neurological: Negative for headaches, focal weakness or numbness.  10-point ROS otherwise negative.  ____________________________________________   PHYSICAL EXAM:  VITAL SIGNS: ED Triage Vitals  Enc Vitals Group     BP 09/12/15 0715 142/108 mmHg     Pulse Rate 09/12/15 0715 86     Resp 09/12/15 0715 20     Temp 09/12/15 0715 98.6 F (37 C)     Temp Source 09/12/15 0715 Oral     SpO2 09/12/15 0715 98 %     Weight 09/12/15 0715 250 lb (113.399 kg)     Height 09/12/15 0715  (1.676 m)     Head Cir --      Peak Flow --      Pain Score 09/12/15 0716 7     Pain Loc --      Pain Edu? --      Excl. in GC? --     Constitutional: Alert and oriented. Well appearing and in no acute distress. Eyes: Conjunctivae are normal. PERRL. EOMI. Head: Atraumatic. Nose: No congestion/rhinnorhea. Mouth/Throat: Mucous membranes are moist.  Oropharynx non-erythematous. Neck: No stridor.   Cardiovascular: Normal rate, regular rhythm. Grossly normal heart sounds.  Good  peripheral circulation. Respiratory: Normal respiratory effort.  No retractions. Lungs CTAB. Gastrointestinal: Soft and nontender. No distention. No abdominal bruits. No CVA tenderness. Musculoskeletal: No lower extremity tenderness nor edema.  No joint effusions. Neurologic:  Normal speech and language. No gross focal neurologic deficits are appreciated. . Skin:  Skin is warm, dry and intact. No rash noted.   ____________________________________________   LABS (all labs ordered are listed, but only abnormal results are displayed)  Labs Reviewed  COMPREHENSIVE METABOLIC PANEL - Abnormal; Notable for the following:    Glucose, Bld 129 (*)    All other components within normal limits  BRAIN NATRIURETIC PEPTIDE  TROPONIN I  CBC WITH DIFFERENTIAL/PLATELET  CK  SEDIMENTATION RATE   ____________________________________________  EKG  EKG read and interpreted by me shows normal sinus rhythm rate of 83 normal axis no acute ST-T wave changes ____________________________________________  RADIOLOGY  CLINICAL DATA: Bee stings last week. Vomiting and nausea. Weakness.  EXAM: PORTABLE CHEST 1 VIEW  COMPARISON: 12/14/2014  FINDINGS: Two AP views of the chest. Midline trachea. Borderline cardiomegaly. No pleural effusion or pneumothorax. Clear lungs.  IMPRESSION: Borderline cardiomegaly, without acute disease.   Electronically Signed  By: Jeronimo Greaves M.D.  On: 09/12/2015 07:49 ____________________________________________   PROCEDURES    Procedures    ____________________________________________   INITIAL IMPRESSION / ASSESSMENT AND PLAN / ED COURSE  Pertinent labs & imaging results that were available during my care of the patient were reviewed by me and considered in my medical decision making (see chart for details).  Patient reports some vertigo. She says she's had this before for quite some time. She said it initially started after she was slammed  onto brick wall by. She says it usually gets worse when she is sick. She has spinning when she moves her head. Started again today. On exam thrust testing indicates a peripheral lesion. She also has latency and some fatigability. There is a small amount of nystagmus. Her exam is otherwise normal. Fundi look essentially normal cranial nerves II through XII intact cerebellar rapid alternating movements in the hands and finger to nose is equal bilaterally. Motor strength is 5 over 5 throughout. Sensation is intact. ____________________________________________   FINAL CLINICAL IMPRESSION(S) / ED DIAGNOSES  Final diagnoses:  Nausea  Vertigo      NEW MEDICATIONS STARTED DURING THIS VISIT:  Discharge Medication List as of 09/12/2015 10:38  AM    START taking these medications   Details  meclizine (ANTIVERT) 32 MG tablet Take 1 tablet (32 mg total) by mouth 3 (three) times daily as needed., Starting 09/12/2015, Until Discontinued, Print    !! ondansetron (ZOFRAN ODT) 4 MG disintegrating tablet Take 1 tablet (4 mg total) by mouth every 8 (eight) hours as needed for nausea or vomiting., Starting 09/12/2015, Until Discontinued, Print    ondansetron (ZOFRAN) 4 MG/2ML SOLN injection Inject 2 mLs (4 mg total) into the vein once as needed for nausea., Starting 09/12/2015, Until Discontinued, Print    !! ondansetron (ZOFRAN-ODT) 4 MG disintegrating tablet Take 1 tablet (4 mg total) by mouth once., Starting 09/12/2015, Print     !! - Potential duplicate medications found. Please discuss with provider.       Note:  This document was prepared using Dragon voice recognition software and may include unintentional dictation errors.    Arnaldo Natal, MD 09/12/15 [phone]

## 2015-09-12 NOTE — ED Notes (Signed)
Nausea better but still feels like room spinning at times.

## 2015-09-12 NOTE — ED Notes (Signed)
Dizziness improved after meclizine. Dr Darnelle Catalan notified.

## 2015-09-12 NOTE — ED Notes (Signed)
Pt states that a yellow jacket nest was disturbed causing the pt to get  10-12 stings. Pt took benadryl for the first 2 days, pt states nausea cont and had some vomiting this am, pt reports weakness and just not feeling well, denies rash or syncope

## 2016-04-26 DIAGNOSIS — K529 Noninfective gastroenteritis and colitis, unspecified: Secondary | ICD-10-CM | POA: Insufficient documentation

## 2016-04-26 DIAGNOSIS — F17210 Nicotine dependence, cigarettes, uncomplicated: Secondary | ICD-10-CM | POA: Insufficient documentation

## 2016-04-26 DIAGNOSIS — I10 Essential (primary) hypertension: Secondary | ICD-10-CM | POA: Insufficient documentation

## 2016-04-26 DIAGNOSIS — Z79899 Other long term (current) drug therapy: Secondary | ICD-10-CM | POA: Insufficient documentation

## 2016-04-26 LAB — URINALYSIS, COMPLETE (UACMP) WITH MICROSCOPIC
BILIRUBIN URINE: NEGATIVE
Glucose, UA: NEGATIVE mg/dL
HGB URINE DIPSTICK: NEGATIVE
KETONES UR: 5 mg/dL — AB
Nitrite: NEGATIVE
Protein, ur: 100 mg/dL — AB
Specific Gravity, Urine: 1.028 (ref 1.005–1.030)
pH: 7 (ref 5.0–8.0)

## 2016-04-26 LAB — CBC
HEMATOCRIT: 43 % (ref 35.0–47.0)
HEMOGLOBIN: 14.8 g/dL (ref 12.0–16.0)
MCH: 30.3 pg (ref 26.0–34.0)
MCHC: 34.3 g/dL (ref 32.0–36.0)
MCV: 88.4 fL (ref 80.0–100.0)
Platelets: 267 10*3/uL (ref 150–440)
RBC: 4.87 MIL/uL (ref 3.80–5.20)
RDW: 12.4 % (ref 11.5–14.5)
WBC: 9.7 10*3/uL (ref 3.6–11.0)

## 2016-04-26 LAB — COMPREHENSIVE METABOLIC PANEL
ALT: 38 U/L (ref 14–54)
ANION GAP: 10 (ref 5–15)
AST: 28 U/L (ref 15–41)
Albumin: 4.4 g/dL (ref 3.5–5.0)
Alkaline Phosphatase: 84 U/L (ref 38–126)
BUN: 11 mg/dL (ref 6–20)
CHLORIDE: 105 mmol/L (ref 101–111)
CO2: 24 mmol/L (ref 22–32)
Calcium: 9.8 mg/dL (ref 8.9–10.3)
Creatinine, Ser: 0.6 mg/dL (ref 0.44–1.00)
Glucose, Bld: 120 mg/dL — ABNORMAL HIGH (ref 65–99)
POTASSIUM: 3.6 mmol/L (ref 3.5–5.1)
Sodium: 139 mmol/L (ref 135–145)
Total Bilirubin: 0.7 mg/dL (ref 0.3–1.2)
Total Protein: 7.9 g/dL (ref 6.5–8.1)

## 2016-04-26 LAB — LIPASE, BLOOD

## 2016-04-26 MED ORDER — ONDANSETRON 4 MG PO TBDP
4.0000 mg | ORAL_TABLET | Freq: Once | ORAL | Status: AC | PRN
  Administered 2016-04-26: 4 mg via ORAL
  Filled 2016-04-26: qty 1

## 2016-04-26 NOTE — ED Triage Notes (Signed)
Pt reports to ED w/ c/o n/v/d that began yesterday.  Pt denies abd pain, CP, SOB, fever or LOC.  Pt alert and oriented, resp even and unlabored. Pt reports not being able to keep down fluids.

## 2016-04-27 ENCOUNTER — Emergency Department
Admission: EM | Admit: 2016-04-27 | Discharge: 2016-04-27 | Disposition: A | Discharge status: Home / Self Care | Payer: Self-pay | Attending: Emergency Medicine | Admitting: Emergency Medicine

## 2016-04-27 DIAGNOSIS — K529 Noninfective gastroenteritis and colitis, unspecified: Secondary | ICD-10-CM

## 2016-04-27 DIAGNOSIS — R112 Nausea with vomiting, unspecified: Secondary | ICD-10-CM

## 2016-04-27 DIAGNOSIS — R197 Diarrhea, unspecified: Secondary | ICD-10-CM

## 2016-04-27 HISTORY — DX: Other cervical disc degeneration, unspecified cervical region: M50.30

## 2016-04-27 HISTORY — DX: Major depressive disorder, single episode, unspecified: F32.9

## 2016-04-27 HISTORY — DX: Depression, unspecified: F32.A

## 2016-04-27 HISTORY — DX: Fibromyalgia: M79.7

## 2016-04-27 MED ORDER — ONDANSETRON HCL 4 MG/2ML IJ SOLN
4.0000 mg | Freq: Once | INTRAMUSCULAR | Status: AC
  Administered 2016-04-27: 4 mg via INTRAVENOUS
  Filled 2016-04-27: qty 2

## 2016-04-27 MED ORDER — SODIUM CHLORIDE 0.9 % IV BOLUS (SEPSIS)
1000.0000 mL | Freq: Once | INTRAVENOUS | Status: AC
  Administered 2016-04-27: 1000 mL via INTRAVENOUS

## 2016-04-27 MED ORDER — ONDANSETRON 4 MG PO TBDP: 4.0000 mg | ORAL_TABLET | Freq: Three times a day (TID) | ORAL | 0 refills | Status: DC | PRN

## 2016-04-27 NOTE — ED Notes (Signed)
Pt was given PO fluids and crackers/peanut butter. Pt able to eat and drink without difficulty.

## 2016-04-27 NOTE — ED Provider Notes (Signed)
[facility] Emergency Department Provider Note   ____________________________________________   First MD Initiated Contact with Patient 04/27/16 [phone]     (approximate)  I have reviewed the triage vital signs and the nursing notes.   HISTORY  Chief Complaint Emesis and Nausea    HPI Kelsey Malone is a 51 y.o. female who comes into the hospital today with vomiting. The patient reports it started yesterday. She thought maybe her gastritis was acting up and took some Maalox. The patient went to bed but woke up this morning and the vomiting resumed. The patient started having some diarrhea as well. She's had a cold recently and thought that it could've been from her drainage. The patient has vomited somewhere between 6 and 8 times with 3 episodes of diarrhea. Her emesis looked like whatever she tried to eat as she's been unable to keep anything down. The patient did receive nausea medicine in triage and hasn't vomited since then. She denies any abdominal pain outside of the ordinary. She reports that she'll have some cramping before she vomits and then soreness afterwards. The patient came into the hospital as she is concerned she may be dehydrated and she has not been able to keep any fluids down.   Past Medical History:  Diagnosis Date  . Anxiety   . Arthritis   . DDD (degenerative disc disease), cervical   . Depression   . Fibromyalgia   . Hypertension     There are no active problems to display for this patient.   Past Surgical History:  Procedure Laterality Date  . ABDOMINAL HYSTERECTOMY    . ABDOMINAL SURGERY    . CESAREAN SECTION      Prior to Admission medications   Medication Sig Start Date End Date Taking? Authorizing Provider  acetaminophen (TYLENOL) 325 MG tablet Take 650 mg by mouth every 6 (six) hours as needed for moderate pain, fever or headache.    Historical Provider, MD  chlorthalidone (HYGROTON) 25 MG tablet Take 0.5 tablets  (12.5 mg total) by mouth daily. 12/14/14   Jennye MoccasinBrian S Quigley, MD  meclizine (ANTIVERT) 32 MG tablet Take 1 tablet (32 mg total) by mouth 3 (three) times daily as needed. 09/12/15   Arnaldo NatalPaul F Malinda, MD  ondansetron (ZOFRAN ODT) 4 MG disintegrating tablet Take 1 tablet (4 mg total) by mouth every 8 (eight) hours as needed for nausea or vomiting. 04/08/15   Rebecka ApleyAllison P Kaycee Mcgaugh, MD  ondansetron (ZOFRAN ODT) 4 MG disintegrating tablet Take 1 tablet (4 mg total) by mouth every 8 (eight) hours as needed for nausea or vomiting. 09/12/15   Arnaldo NatalPaul F Malinda, MD  ondansetron (ZOFRAN ODT) 4 MG disintegrating tablet Take 1 tablet (4 mg total) by mouth every 8 (eight) hours as needed for nausea or vomiting. 04/27/16   Rebecka ApleyAllison P Oluwaseyi Raffel, MD  ondansetron [facility](ZOFRAN) 4 MG/2ML SOLN injection Inject 2 mLs (4 mg total) into the vein once as needed for nausea. 09/12/15   Arnaldo NatalPaul F Malinda, MD  ondansetron (ZOFRAN-ODT) 4 MG disintegrating tablet Take 1 tablet (4 mg total) by mouth once. 09/12/15   Arnaldo NatalPaul F Malinda, MD  pantoprazole (PROTONIX) 20 MG tablet Take 1 tablet (20 mg total) by mouth daily. Patient not taking: Reported on 04/08/2015 12/14/14 12/14/15  Jennye MoccasinBrian S Quigley, MD  ranitidine (ZANTAC) 150 MG tablet Take 150 mg by mouth daily.    Historical Provider, MD    Allergies Opium and Penicillins  No family history on file.  Social History  Social History  Substance Use Topics  . Smoking status: Current Some Day Smoker    Packs/day: 0.50    Types: Cigarettes  . Smokeless tobacco: Never Used  . Alcohol use Yes    Review of Systems Constitutional: No fever/chills Eyes: No visual changes. ENT: No sore throat. Cardiovascular: Denies chest pain. Respiratory: Denies shortness of breath. Gastrointestinal: Nausea, vomiting, diarrhea No abdominal pain. No constipation. Genitourinary: Negative for dysuria. Musculoskeletal: Negative for back pain. Skin: Negative for rash. Neurological: Negative for headaches, focal weakness or  numbness.  10-point ROS otherwise negative.  ____________________________________________   PHYSICAL EXAM:  VITAL SIGNS: ED Triage Vitals [04/26/16 2142]  Enc Vitals Group     BP (!) 153/97     Pulse Rate 85     Resp 20     Temp 98.1 F (36.7 C)     Temp Source Oral     SpO2 97 %     Weight 250 lb (113.4 kg)     Height 5\' 6"  (1.676 m)     Head Circumference      Peak Flow      Pain Score 0     Pain Loc      Pain Edu?      Excl. in GC?     Constitutional: Alert and oriented. Well appearing and in Mild distress. Eyes: Conjunctivae are normal. PERRL. EOMI. Head: Atraumatic. Nose: No congestion/rhinnorhea. Mouth/Throat: Mucous membranes are moist.  Oropharynx non-erythematous. Cardiovascular: Normal rate, regular rhythm. Grossly normal heart sounds.  Good peripheral circulation. Respiratory: Normal respiratory effort.  No retractions. Lungs CTAB. Gastrointestinal: Soft and nontender. No distention. Positive bowel sounds Musculoskeletal: No lower extremity tenderness nor edema.   Neurologic:  Normal speech and language.  Skin:  Skin is warm, dry and intact.  Psychiatric: Mood and affect are normal.   ____________________________________________   LABS (all labs ordered are listed, but only abnormal results are displayed)  Labs Reviewed  LIPASE, BLOOD - Abnormal; Notable for the following:       Result Value   Lipase <10 (*)    All other components within normal limits  COMPREHENSIVE METABOLIC PANEL - Abnormal; Notable for the following:    Glucose, Bld 120 (*)    All other components within normal limits  URINALYSIS, COMPLETE (UACMP) WITH MICROSCOPIC - Abnormal; Notable for the following:    Color, Urine AMBER (*)    APPearance HAZY (*)    Ketones, ur 5 (*)    Protein, ur 100 (*)    Leukocytes, UA TRACE (*)    Bacteria, UA RARE (*)    Squamous Epithelial / LPF 6-30 (*)    All other components within normal limits  CBC    ____________________________________________  EKG  none ____________________________________________  RADIOLOGY  none ____________________________________________   PROCEDURES  Procedure(s) performed: None  Procedures  Critical Care performed: No  ____________________________________________   INITIAL IMPRESSION / ASSESSMENT AND PLAN / ED COURSE  Pertinent labs & imaging results that were available during my care of the patient were reviewed by me and considered in my medical decision making (see chart for details).  This is a 51 year old who comes into the hospital today with some vomiting and diarrhea. The patient has not had any fever and reports that she is concerned about dehydration. The patient had some blood work that was unremarkable. She doesn't have any abdominal pain. I will give the patient a liter of normal saline and I will give her some more Zofran. I will  have the patient take some by mouth and if she is able to keep fluids down she'll be discharged home. The patient likely has some gastroenteritis causing her symptoms.     The patient feels improved and will be discharged home. She was able to take liquids by mouth without any vomiting. ____________________________________________   FINAL CLINICAL IMPRESSION(S) / ED DIAGNOSES  Final diagnoses:  Nausea vomiting and diarrhea  Gastroenteritis      NEW MEDICATIONS STARTED DURING THIS VISIT:  New Prescriptions   ONDANSETRON (ZOFRAN ODT) 4 MG DISINTEGRATING TABLET    Take 1 tablet (4 mg total) by mouth every 8 (eight) hours as needed for nausea or vomiting.     Note:  This document was prepared using Dragon voice recognition software and may include unintentional dictation errors.    Rebecka Apley, MD 04/27/16 [phone]

## 2016-04-27 NOTE — ED Notes (Signed)
MD Zenda AlpersWebster at bedside at this time,.

## 2017-05-08 IMAGING — CR DG CHEST 2V
2 series · 2 of 2 positions shown · non-contrast
Comparison: None.

CLINICAL DATA: Acute chest pain since [REDACTED], hypertension and
anxiety. Smoker.

EXAM:
CHEST  2 VIEW

[chest pa]
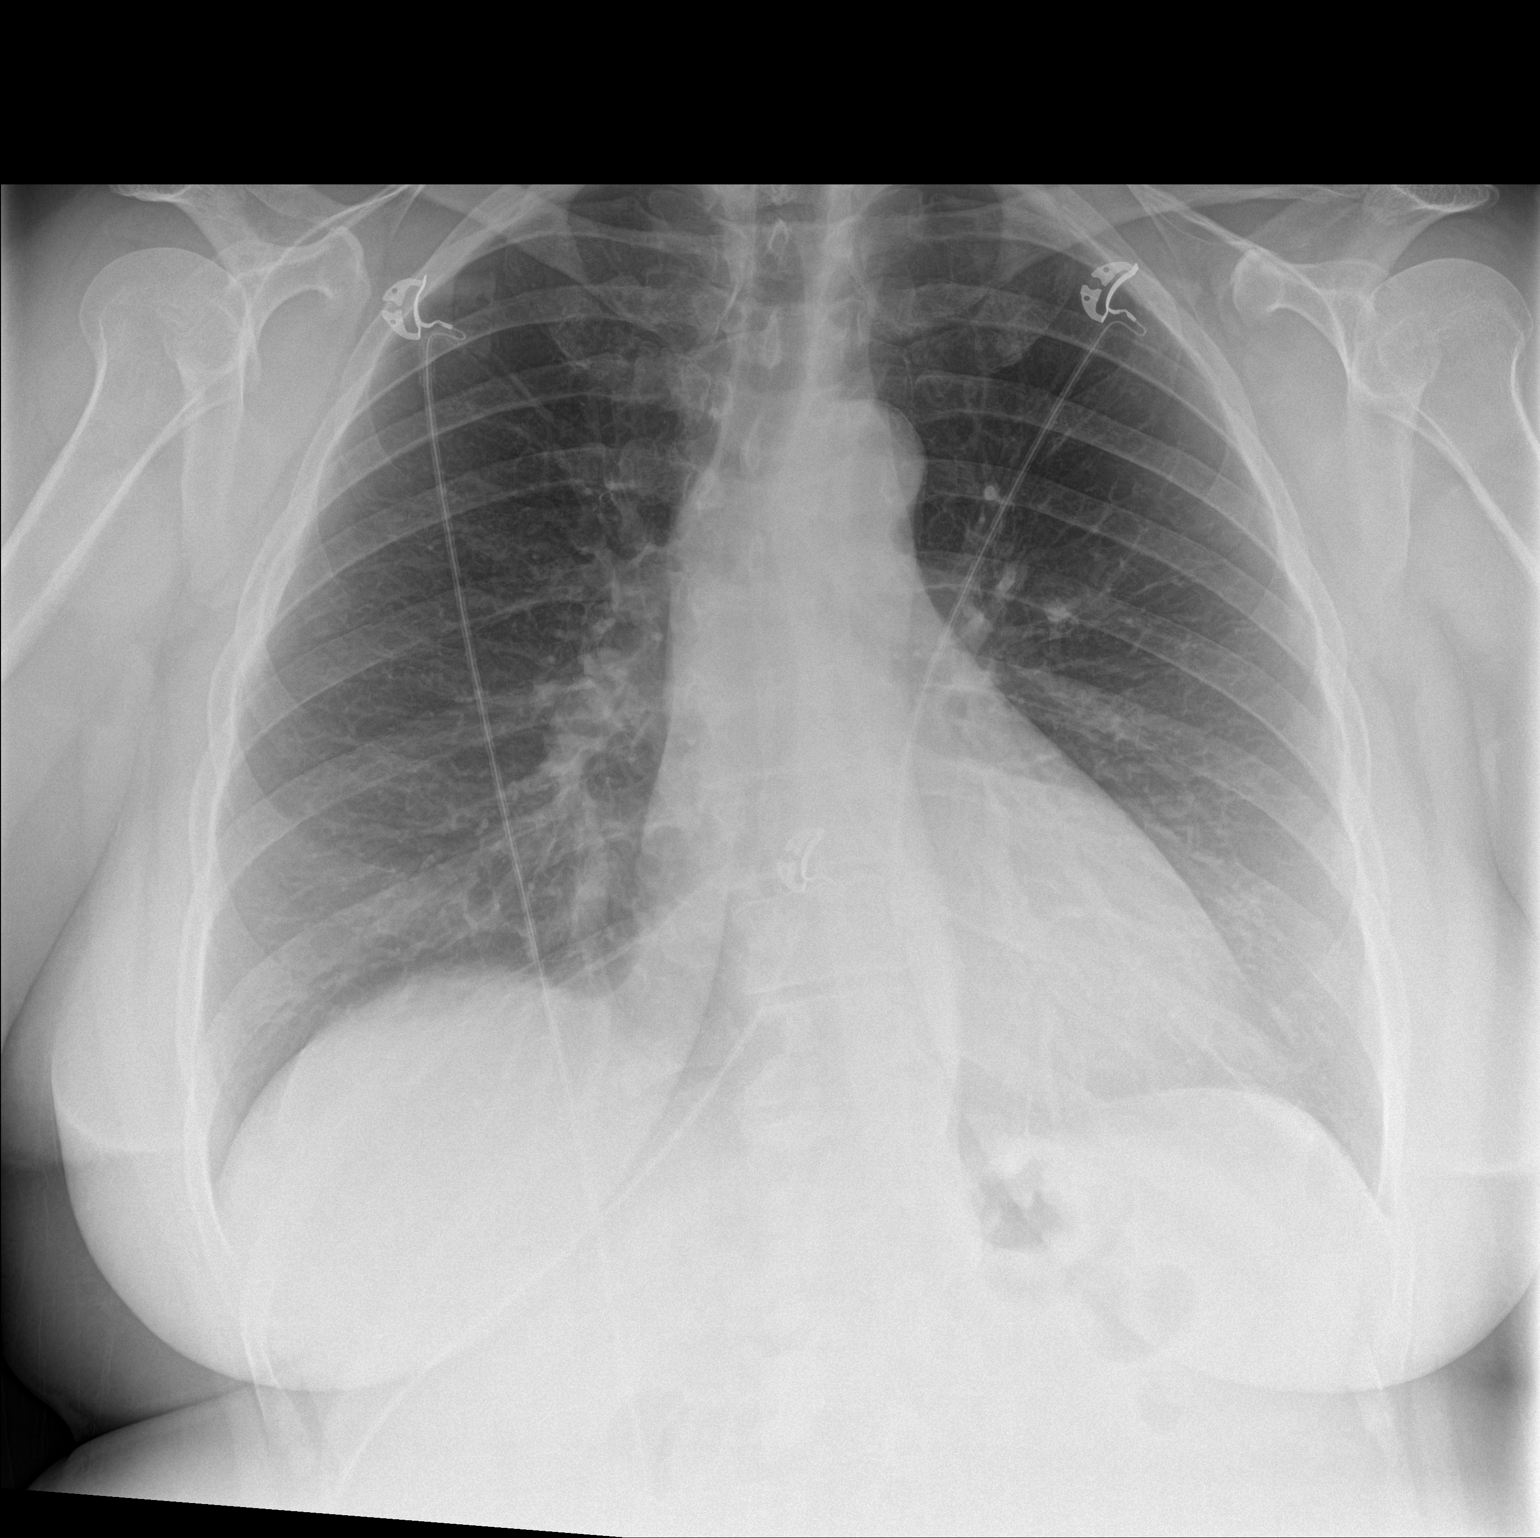

[chest lat]
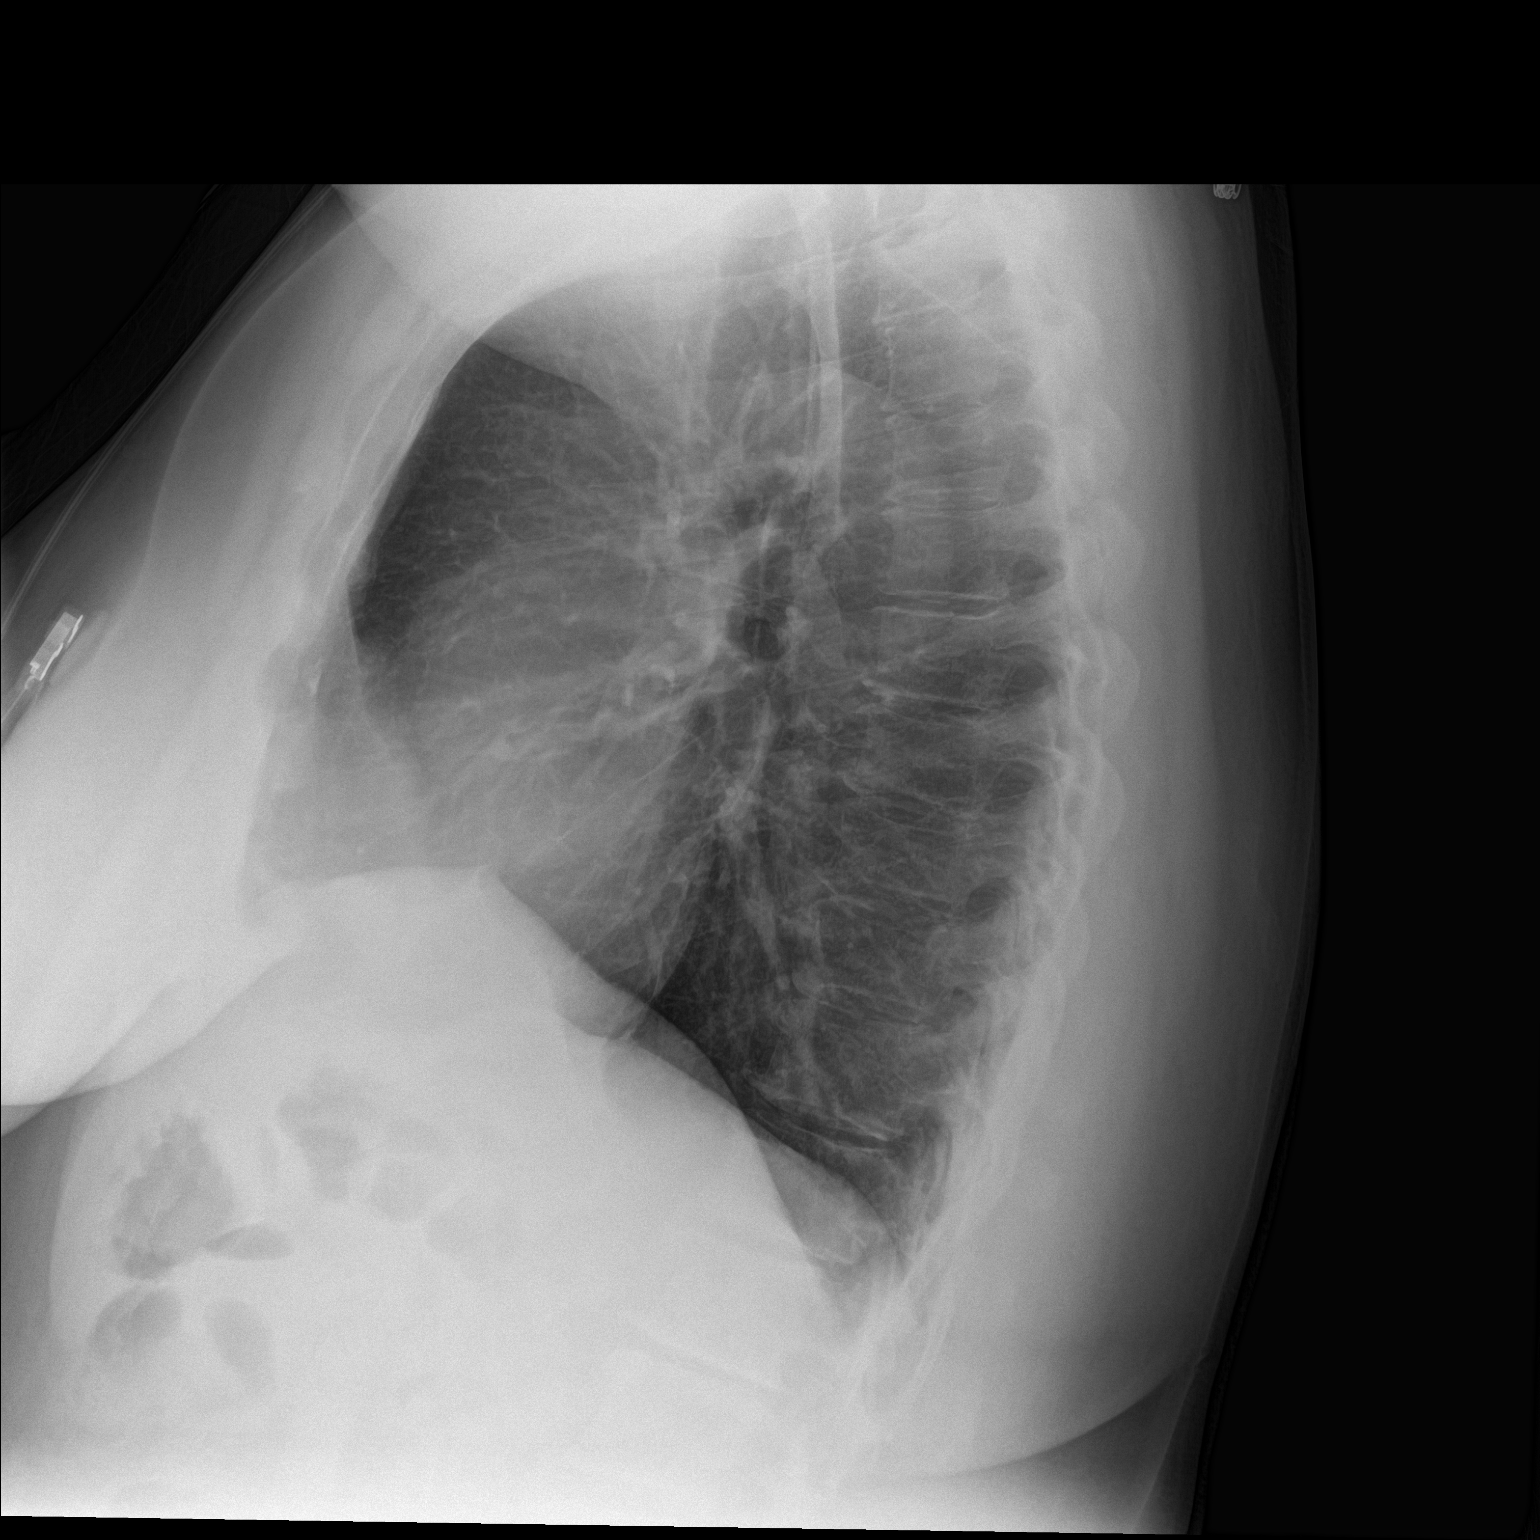

[2 of 2 positions shown; findings below may reference images not displayed]

FINDINGS: Normal heart size and vascularity. No focal pneumonia, collapse or
consolidation. Negative for edema, effusion or pneumothorax. Trachea
is midline. Mild scoliosis of the spine.
IMPRESSION: No acute chest process.

## 2017-08-19 ENCOUNTER — Other Ambulatory Visit: Payer: Self-pay

## 2017-08-19 ENCOUNTER — Emergency Department
Admission: EM | Admit: 2017-08-19 | Discharge: 2017-08-19 | Disposition: A | Discharge status: Home / Self Care | Payer: Self-pay | Attending: Emergency Medicine | Admitting: Emergency Medicine

## 2017-08-19 DIAGNOSIS — I10 Essential (primary) hypertension: Secondary | ICD-10-CM | POA: Insufficient documentation

## 2017-08-19 DIAGNOSIS — K047 Periapical abscess without sinus: Secondary | ICD-10-CM | POA: Insufficient documentation

## 2017-08-19 DIAGNOSIS — Z79899 Other long term (current) drug therapy: Secondary | ICD-10-CM | POA: Insufficient documentation

## 2017-08-19 DIAGNOSIS — F17210 Nicotine dependence, cigarettes, uncomplicated: Secondary | ICD-10-CM | POA: Insufficient documentation

## 2017-08-19 DIAGNOSIS — K0889 Other specified disorders of teeth and supporting structures: Secondary | ICD-10-CM | POA: Insufficient documentation

## 2017-08-19 MED ORDER — AZITHROMYCIN 500 MG PO TABS
500.0000 mg | ORAL_TABLET | Freq: Once | ORAL | Status: AC
  Administered 2017-08-19: 500 mg via ORAL
  Filled 2017-08-19: qty 1

## 2017-08-19 MED ORDER — DOXYCYCLINE HYCLATE 50 MG PO CAPS: 100.0000 mg | ORAL_CAPSULE | Freq: Two times a day (BID) | ORAL | 0 refills | Status: AC

## 2017-08-19 MED ORDER — LIDOCAINE VISCOUS HCL 2 % MT SOLN
15.0000 mL | Freq: Once | OROMUCOSAL | Status: AC
  Administered 2017-08-19: 15 mL via OROMUCOSAL
  Filled 2017-08-19: qty 15

## 2017-08-19 MED ORDER — KETOROLAC TROMETHAMINE 10 MG PO TABS
10.0000 mg | ORAL_TABLET | Freq: Once | ORAL | Status: AC
  Administered 2017-08-19: 10 mg via ORAL
  Filled 2017-08-19: qty 1

## 2017-08-19 MED ORDER — IBUPROFEN 800 MG PO TABS: 800.0000 mg | ORAL_TABLET | Freq: Three times a day (TID) | ORAL | 0 refills | Status: DC | PRN

## 2017-08-19 NOTE — ED Triage Notes (Signed)
Pt here with toothache x 2 days states has partial that has cracked.

## 2017-08-19 NOTE — ED Provider Notes (Signed)
[facility] Emergency Department Provider Note   First MD Initiated Contact with Patient 08/19/17 [phone]     (approximate)  I have reviewed the triage vital signs and the nursing notes.   HISTORY  Chief Complaint Dental Pain    HPI Kelsey Malone is a 52 y.o. adult with below list of chronic medical conditions presents to the emergency department with a 2-day history of left molar dental pain.  Patient states current pain score is 9 out of 10.  Worse with hot or cold.  Patient denies any fever no difficulty swallowing.  Patient afebrile on presentation   Past Medical History:  Diagnosis Date  . Anxiety   . Arthritis   . DDD (degenerative disc disease), cervical   . Depression   . Fibromyalgia   . Hypertension     There are no active problems to display for this patient.   Past Surgical History:  Procedure Laterality Date  . ABDOMINAL HYSTERECTOMY    . ABDOMINAL SURGERY    . CESAREAN SECTION      Prior to Admission medications   Medication Sig Start Date End Date Taking? Authorizing Provider  acetaminophen (TYLENOL) 325 MG tablet Take 650 mg by mouth every 6 (six) hours as needed for moderate pain, fever or headache.    [provider]  chlorthalidone (HYGROTON) 25 MG tablet Take 0.5 tablets (12.5 mg total) by mouth daily. 12/14/14   Jennye Moccasin, MD  doxycycline (VIBRAMYCIN) 50 MG capsule Take 2 capsules (100 mg total) by mouth 2 (two) times daily for 10 days. 08/19/17 08/29/17  Darci Current, MD  ibuprofen (ADVIL,MOTRIN) 800 MG tablet Take 1 tablet (800 mg total) by mouth every 8 (eight) hours as needed. 08/19/17   Darci Current, MD  meclizine (ANTIVERT) 32 MG tablet Take 1 tablet (32 mg total) by mouth 3 (three) times daily as needed. 09/12/15   Arnaldo Natal, MD  ondansetron (ZOFRAN ODT) 4 MG disintegrating tablet Take 1 tablet (4 mg total) by mouth every 8 (eight) hours as needed for nausea or vomiting. 04/08/15   Rebecka Apley, MD  ondansetron (ZOFRAN ODT) 4 MG disintegrating tablet Take 1 tablet (4 mg total) by mouth every 8 (eight) hours as needed for nausea or vomiting. 09/12/15   Arnaldo Natal, MD  ondansetron (ZOFRAN ODT) 4 MG disintegrating tablet Take 1 tablet (4 mg total) by mouth every 8 (eight) hours as needed for nausea or vomiting. 04/27/16   Rebecka Apley, MD  ondansetron [facility]) 4 MG/2ML SOLN injection Inject 2 mLs (4 mg total) into the vein once as needed for nausea. 09/12/15   Arnaldo Natal, MD  ondansetron (ZOFRAN-ODT) 4 MG disintegrating tablet Take 1 tablet (4 mg total) by mouth once. 09/12/15   Arnaldo Natal, MD  pantoprazole (PROTONIX) 20 MG tablet Take 1 tablet (20 mg total) by mouth daily. Patient not taking: Reported on 04/08/2015 12/14/14 12/14/15  Jennye Moccasin, MD  ranitidine (ZANTAC) 150 MG tablet Take 150 mg by mouth daily.    [provider]    Allergies Opium and Penicillins  No family history on file.  Social History Social History   Tobacco Use  . Smoking status: Current Some Day Smoker    Packs/day: 0.50    Types: Cigarettes  . Smokeless tobacco: Never Used  Substance Use Topics  . Alcohol use: Yes  . Drug use: Not on file    Review of Systems Constitutional:  No fever/chills Eyes: No visual changes. ENT: No sore throat. Mouth: Positive for dental pain Cardiovascular: Denies chest pain. Respiratory: Denies shortness of breath. Gastrointestinal: No abdominal pain.  No nausea, no vomiting.  No diarrhea.  No constipation. Genitourinary: Negative for dysuria. Musculoskeletal: Negative for neck pain.  Negative for back pain. Integumentary: Negative for rash. Neurological: Negative for headaches, focal weakness or numbness.   ____________________________________________   PHYSICAL EXAM:  VITAL SIGNS: ED Triage Vitals  Enc Vitals Group     BP 08/19/17 0403 (!) 179/104     Pulse Rate 08/19/17 0403 76     Resp 08/19/17 0403 18     Temp  08/19/17 0403 98 F (36.7 C)     Temp Source 08/19/17 0403 Oral     SpO2 08/19/17 0403 96 %     Weight 08/19/17 0359 108.9 kg (240 lb)     Height 08/19/17 0359 1.651 m (5\' 5" )     Head Circumference --      Peak Flow --      Pain Score 08/19/17 0359 7     Pain Loc --      Pain Edu? --      Excl. in GC? --     Constitutional: Alert and oriented. Well appearing and in no acute distress. Eyes: Conjunctivae are normal.  Head: Atraumatic. Mouth/Throat: Mucous membranes are moist.  Oropharynx non-erythematous.  Gingival hyperplasia noted around left upper molar very tender to palpation.  Normal appearance floor the mouth. Neck: No stridor.  No erythema no lymphadenopathy Neurologic:  Normal speech and language. No gross focal neurologic deficits are appreciated.  Skin:  Skin is warm, dry and intact. No rash noted. Psychiatric: Mood and affect are normal. Speech and behavior are normal.     Procedures   ____________________________________________   INITIAL IMPRESSION / ASSESSMENT AND PLAN / ED COURSE  As part of my medical decision making, I reviewed the following data within the electronic MEDICAL RECORD NUMBER   52 year old female presented with above-stated history and physical exam secondary to dental pain.  Patient given viscous lidocaine swish and spit Toradol and erythromycin the emergency department secondary to penicillin allergy.     ____________________________________________  FINAL CLINICAL IMPRESSION(S) / ED DIAGNOSES  Final diagnoses:  Pain, dental  Dental abscess     MEDICATIONS GIVEN DURING THIS VISIT:  Medications  ketorolac (TORADOL) tablet 10 mg (10 mg Oral Given 08/19/17 0424)  azithromycin (ZITHROMAX) tablet 500 mg (500 mg Oral Given 08/19/17 0424)  lidocaine (XYLOCAINE) 2 % viscous mouth solution 15 mL (15 mLs Mouth/Throat Given 08/19/17 0424)     ED Discharge Orders        Ordered    doxycycline (VIBRAMYCIN) 50 MG capsule  2 times daily      08/19/17 0443    ibuprofen (ADVIL,MOTRIN) 800 MG tablet  Every 8 hours PRN     08/19/17 0444       Note:  This document was prepared using Dragon voice recognition software and may include unintentional dictation errors.    Darci CurrentBrown, [place], MD 08/19/17 [phone]

## 2018-02-04 IMAGING — DX DG CHEST 1V PORT
1 series · 2 of 2 positions shown · non-contrast
Comparison: 12/14/2014

CLINICAL DATA: Joslei stings last week. Vomiting and nausea. Weakness.

EXAM:
PORTABLE CHEST 1 VIEW

[Series 1: chest ap · 0.14mm/px · 2 of 2 slices shown]
[im 1/2]
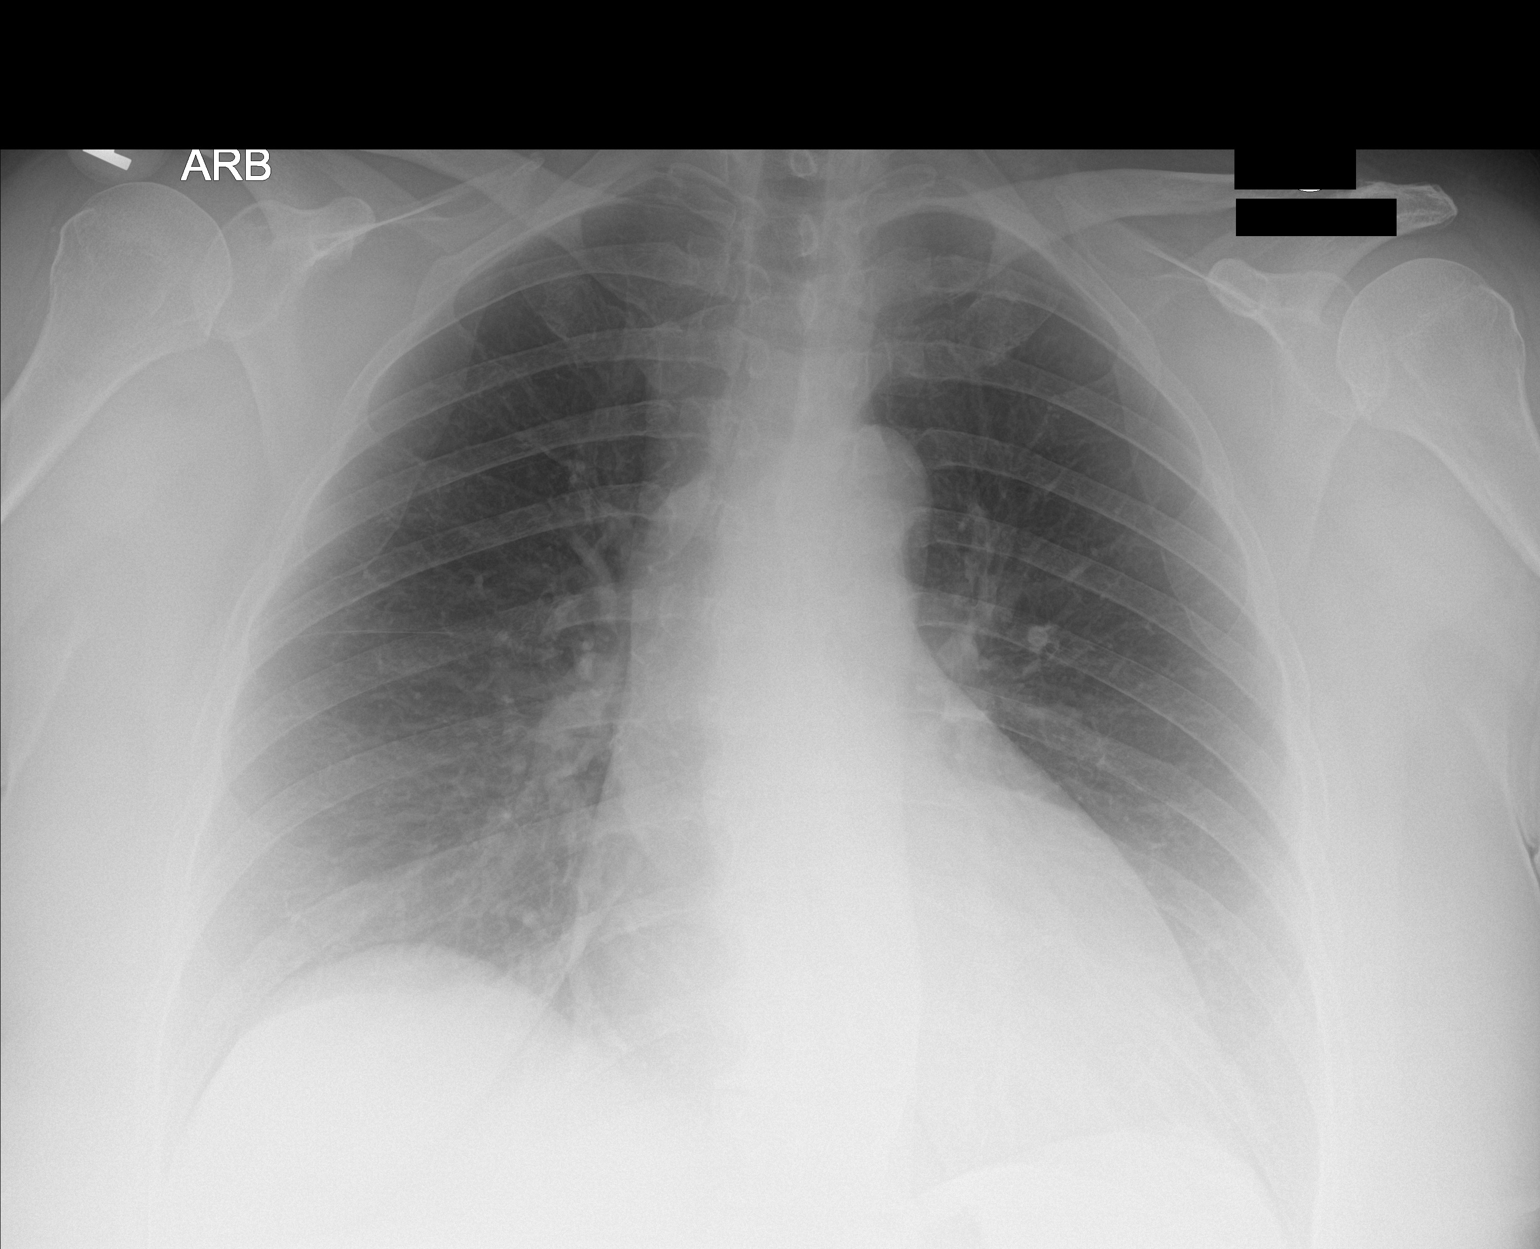
[im 2/2]
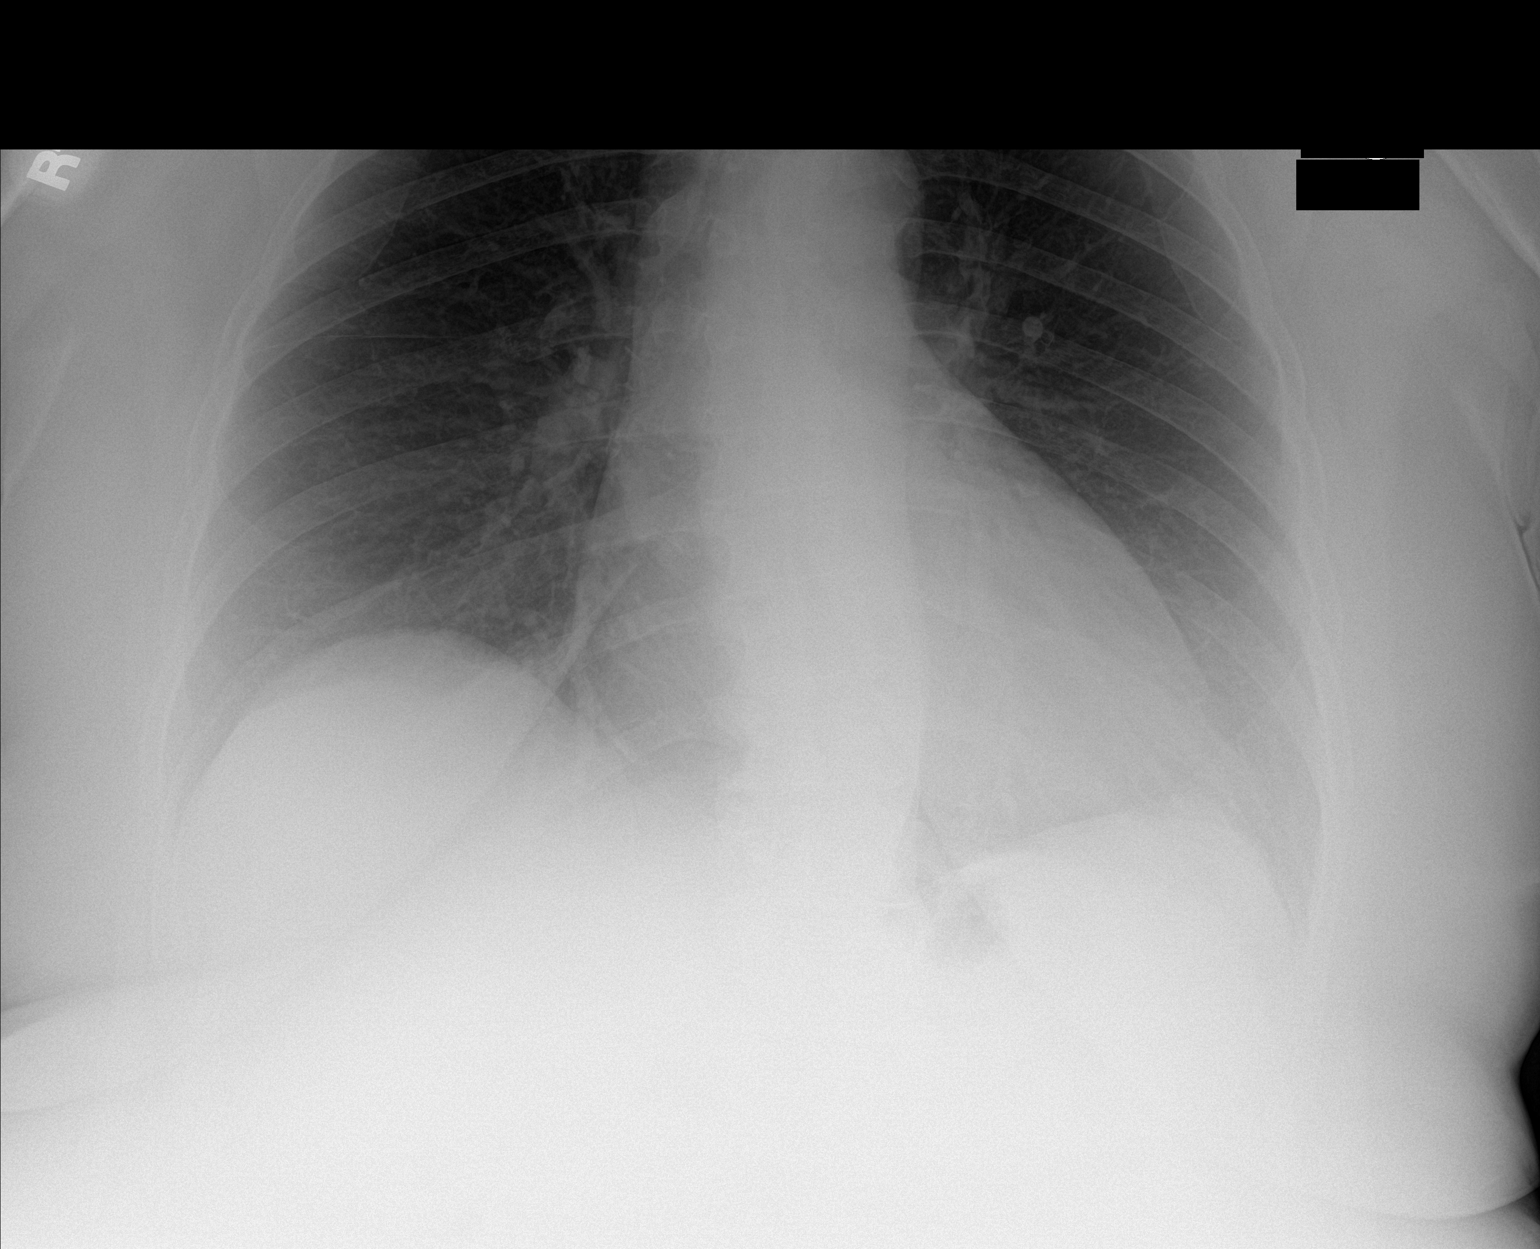

[2 of 2 positions shown; findings below may reference images not displayed]

FINDINGS: Two AP views of the chest. Midline trachea. Borderline cardiomegaly.
No pleural effusion or pneumothorax. Clear lungs.
IMPRESSION: Borderline cardiomegaly, without acute disease.

## 2018-04-23 ENCOUNTER — Emergency Department
Admission: EM | Admit: 2018-04-23 | Discharge: 2018-04-23 | Disposition: A | Discharge status: Home / Self Care | Payer: Self-pay | Attending: Emergency Medicine | Admitting: Emergency Medicine

## 2018-04-23 ENCOUNTER — Encounter: Payer: Self-pay | Admitting: Emergency Medicine

## 2018-04-23 DIAGNOSIS — F329 Major depressive disorder, single episode, unspecified: Secondary | ICD-10-CM | POA: Insufficient documentation

## 2018-04-23 DIAGNOSIS — F17210 Nicotine dependence, cigarettes, uncomplicated: Secondary | ICD-10-CM | POA: Insufficient documentation

## 2018-04-23 DIAGNOSIS — K047 Periapical abscess without sinus: Secondary | ICD-10-CM | POA: Insufficient documentation

## 2018-04-23 DIAGNOSIS — I10 Essential (primary) hypertension: Secondary | ICD-10-CM | POA: Insufficient documentation

## 2018-04-23 DIAGNOSIS — Z79899 Other long term (current) drug therapy: Secondary | ICD-10-CM | POA: Insufficient documentation

## 2018-04-23 DIAGNOSIS — F419 Anxiety disorder, unspecified: Secondary | ICD-10-CM | POA: Insufficient documentation

## 2018-04-23 MED ORDER — FLUCONAZOLE 150 MG PO TABS: ORAL_TABLET | ORAL | 0 refills | Status: DC

## 2018-04-23 MED ORDER — ERYTHROMYCIN 500 MG PO TBEC: 500.0000 mg | DELAYED_RELEASE_TABLET | Freq: Three times a day (TID) | ORAL | 0 refills | Status: DC

## 2018-04-23 NOTE — Discharge Instructions (Addendum)
OPTIONS FOR DENTAL FOLLOW UP CARE ° °[place] [facility] and [facility] - Local Safety Net Dental Clinics °http://www.ncdhhs.gov/dph/oralhealth/services/safetynetclinics.htm °  °[facility] (336-562-3123) ° °[facility] [place] (919-933-9087) ° °[facility] ([phone] ext [phone]) ° °[facility] ([phone]) ° °[facility] ([phone]) °This clinic caters to the indigent population and is on a lottery system. °Location: °[facility], [address], [place] °Clinic Hours: °Wednesdays from 6pm - 9pm, patients seen by a lottery system. °For dates, call or go to www.med.[facility].edu/[facility]/patients/Dental-[facility] °Services: °Cleanings, fillings and simple extractions. °Payment Options: °DENTAL WORK IS FREE OF CHARGE. Bring proof of income or support. °Best way to get seen: °Arrive at 5:15 pm - this is a lottery, NOT first come/first serve, so arriving earlier will not increase your chances of being seen. °  °  °[facility] °[phone] °Select option 1 for emergencies °  °Location: °[facility], [address], [place] °Clinic Hours: °No walk-ins accepted - call the day before to schedule an appointment. °Check in times are 9:30 am and 1:30 pm. °Services: °Simple extractions, temporary fillings, pulpectomy/pulp debridement, uncomplicated abscess drainage. °Payment Options: °PAYMENT IS DUE AT THE TIME OF SERVICE.  Fee is usually $100-200, additional surgical procedures (e.g. abscess drainage) may be extra. °Cash, checks, Visa/MasterCard accepted.  Can file Medicaid if patient is covered for dental - patient should call case worker to check. °No discount for [facility] patients. °Best way to get seen: °MUST call the day before and get onto the schedule. Can usually be seen the next 1-2 days. No walk-ins accepted. °  °  °[facility] °[phone] °   °Location: °[facility], [address], [place] °Clinic Hours: °M, W, Th, F 8am or 1:30pm, Tues 9a or 1:30 - first come/first served. °Services: °Simple extractions, temporary fillings, uncomplicated abscess drainage.  You do not need to be an [place] resident. °Payment Options: °PAYMENT IS DUE AT THE TIME OF SERVICE. °Dental insurance, otherwise sliding scale - bring proof of income or support. °Depending on income and treatment needed, cost is usually $50-200. °Best way to get seen: °Arrive early as it is first come/first served. °  °  °[facility] °[phone] °  °Location: °[address] °Clinic Hours: °Mon-Thu 8a-5p °Services: °Most basic dental services including extractions and fillings. °Payment Options: °PAYMENT IS DUE AT THE TIME OF SERVICE. °Sliding scale, up to 50% off - bring proof if income or support. °Medicaid with dental option accepted. °Best way to get seen: °Call to schedule an appointment, can usually be seen within 2 weeks OR they will try to see walk-ins - show up at 8a or 2p (you may have to wait). °  °  °[facility] °919-245-2435 °[place] RESIDENTS ONLY °  °Location: °[facility], [address], [place], [place] [address] °Clinic Hours: By appointment only. °Monday - Thursday 8am-5pm, Friday 8am-12pm °Services: Cleanings, fillings, extractions. °Payment Options: °PAYMENT IS DUE AT THE TIME OF SERVICE. °Cash, Visa or MasterCard. Sliding scale - $30 minimum per service. °Best way to get seen: °Come in to office, complete packet and make an appointment - need proof of income °or support monies for each household member and proof of [place] residence. °Usually takes about a month to get in. °  °  °[facility] °[phone] °  °Location: °[address]   [place] °Clinic Hours: Walk-in Urgent Care Dental Services are offered Monday-Friday  mornings only. °The numbers of emergencies accepted daily is limited to the number of °providers available. °Maximum 15 - Mondays, Wednesdays & Thursdays °Maximum 10 - Tuesdays & Fridays °Services: °You do not need to be a [place] resident to be seen for a dental emergency. °Emergencies are defined as pain, swelling, abnormal bleeding, or dental trauma. Walkins will receive x-rays if needed. °NOTE: Dental cleaning is not an emergency. °Payment Options: °PAYMENT IS DUE AT THE TIME OF SERVICE. °Minimum co-pay is $40.00 for uninsured patients. °Minimum co-pay is $3.00 for Medicaid with dental coverage. °Dental Insurance is accepted and must be presented at time of visit. °Medicare does not cover dental. °Forms of payment: Cash, credit card, checks. °Best way to get seen: °If not previously registered with the clinic, walk-in dental registration begins at 7:15 am and is on a first come/first serve basis. °If previously registered with the clinic, call to make an appointment. °  °  °[facility] °[phone] °[place] RESIDENTS ONLY °  °Location: °[address], [place], [place] °Clinic Hours: °Mon-Thu 10a-2p °Services: Extractions only! °Payment Options: °FREE (donations accepted) - bring proof of income or support °Best way to get seen: °Call and schedule an appointment OR come at 8am on the 1st Monday of every month (except for holidays) when it is first come/first served. °  °  °[facility] °[phone] °  °Location: °[address], [place] °Clinic Hours: °Friday mornings °Services, Payment Options, Best way to get seen: °Call for info °

## 2018-04-23 NOTE — ED Triage Notes (Signed)
Pt reports has a tooth broke but can't afford the dentist. Pt states that she did all the home remedies and no relief. Pt feels like she needs an abx.

## 2018-04-23 NOTE — ED Notes (Signed)
See triage note  Presents with a broken tooth    having increased pain this am

## 2018-04-23 NOTE — ED Provider Notes (Signed)
[facility] Emergency Department Provider Note  ____________________________________________   First MD Initiated Contact with Patient 04/23/18 1024     (approximate)  I have reviewed the triage vital signs and the nursing notes.   HISTORY  Chief Complaint Dental Pain    HPI Kelsey Malone is a 53 y.o. adult presents to the emergency department complaining of left-sided tooth pain and jaw swelling.  States she has a history of severe dental problems.  Feels like she needs an antibiotic at this time.  She is allergic to all penicillins.    Past Medical History:  Diagnosis Date  . Anxiety   . Arthritis   . DDD (degenerative disc disease), cervical   . Depression   . Fibromyalgia   . Hypertension     There are no active problems to display for this patient.   Past Surgical History:  Procedure Laterality Date  . ABDOMINAL HYSTERECTOMY    . ABDOMINAL SURGERY    . CESAREAN SECTION      Prior to Admission medications   Medication Sig Start Date End Date Taking? Authorizing Provider  chlorthalidone (HYGROTON) 25 MG tablet Take 0.5 tablets (12.5 mg total) by mouth daily. 12/14/14   Jennye Moccasin, MD  Erythromycin 500 MG TBEC Take 500 mg by mouth 3 (three) times daily. 04/23/18   Sherrie Mustache Roselyn Bering, PA-C  fluconazole (DIFLUCAN) 150 MG tablet Take one now and one in a week 04/23/18   Sherrie Mustache, Roselyn Bering, PA-C  ranitidine (ZANTAC) 150 MG tablet Take 150 mg by mouth daily.    [provider]    Allergies Paregoric; Opium; Penicillins; and Tramadol  No family history on file.  Social History Social History   Tobacco Use  . Smoking status: Current Some Day Smoker    Packs/day: 0.50    Types: Cigarettes  . Smokeless tobacco: Never Used  Substance Use Topics  . Alcohol use: Yes  . Drug use: Not on file    Review of Systems  Constitutional: No fever/chills Eyes: No visual changes. ENT: No sore throat.  Positive for dental pain and  left-sided jaw swelling Respiratory: Denies cough Genitourinary: Negative for dysuria. Musculoskeletal: Negative for back pain. Skin: Negative for rash.    ____________________________________________   PHYSICAL EXAM:  VITAL SIGNS: ED Triage Vitals  Enc Vitals Group     BP 04/23/18 0915 (!) 153/99     Pulse Rate 04/23/18 0915 87     Resp 04/23/18 0915 20     Temp 04/23/18 0915 98.3 F (36.8 C)     Temp Source 04/23/18 0915 Oral     SpO2 04/23/18 0915 99 %     Weight 04/23/18 0916 240 lb (108.9 kg)     Height 04/23/18 0916 5\' 6"  (1.676 m)     Head Circumference --      Peak Flow --      Pain Score 04/23/18 0916 4     Pain Loc --      Pain Edu? --      Excl. in GC? --     Constitutional: Alert and oriented. Well appearing and in no acute distress. Eyes: Conjunctivae are normal.  Head: Atraumatic. Nose: No congestion/rhinnorhea. Mouth/Throat: Mucous membranes are moist.  Left-sided jaw swelling is noted.  A left lower molar is broken at the gumline with swelling noted along the gum Neck:  supple no lymphadenopathy noted Cardiovascular: Normal rate, regular rhythm. Heart sounds are normal Respiratory: Normal respiratory effort.  No  retractions, lungs c t a  GU: deferred Musculoskeletal: FROM all extremities, warm and well perfused Neurologic:  Normal speech and language.  Skin:  Skin is warm, dry and intact. No rash noted. Psychiatric: Mood and affect are normal. Speech and behavior are normal.  ____________________________________________   LABS (all labs ordered are listed, but only abnormal results are displayed)  Labs Reviewed - No data to display ____________________________________________   ____________________________________________  RADIOLOGY    ____________________________________________   PROCEDURES  Procedure(s) performed: No  Procedures    ____________________________________________   INITIAL IMPRESSION / ASSESSMENT AND PLAN / ED  COURSE  Pertinent labs & imaging results that were available during my care of the patient were reviewed by me and considered in my medical decision making (see chart for details).   Patient is a 53 year old transgender female to female who complains of left-sided dental pain and swelling.  Physical exam shows a left lower gumline to be swollen.  No lymphadenopathy.  Remainder of exam is unremarkable.  Explained findings to the patient.  Patient was given a prescription for erythromycin 500 3 times daily and Diflucan to prevent yeast as the patient had stated yeast is a problem with antibiotics.  Patient is to follow-up with the dental clinics provided.  Patient was discharged in stable condition.     As part of my medical decision making, I reviewed the following data within the electronic MEDICAL RECORD NUMBER History obtained from family, Nursing notes reviewed and incorporated, Old chart reviewed, Notes from prior ED visits and [place] Controlled Substance Database  ____________________________________________   FINAL CLINICAL IMPRESSION(S) / ED DIAGNOSES  Final diagnoses:  Dental abscess      NEW MEDICATIONS STARTED DURING THIS VISIT:  Discharge Medication List as of 04/23/2018 10:46 AM    START taking these medications   Details  Erythromycin 500 MG TBEC Take 500 mg by mouth 3 (three) times daily., Starting Mon 04/23/2018, Normal    fluconazole (DIFLUCAN) 150 MG tablet Take one now and one in a week, Normal         Note:  This document was prepared using Dragon voice recognition software and may include unintentional dictation errors.    Faythe Ghee, PA-C 04/23/18 1113    Don Perking, [place], MD 04/25/18 2322

## 2018-09-24 ENCOUNTER — Other Ambulatory Visit: Payer: Self-pay

## 2018-09-24 ENCOUNTER — Ambulatory Visit
Admission: RE | Admit: 2018-09-24 | Discharge: 2018-09-24 | Disposition: A | Discharge status: Home / Self Care | Payer: Self-pay | Source: Ambulatory Visit | Attending: Emergency Medicine | Admitting: Emergency Medicine

## 2018-09-24 DIAGNOSIS — K047 Periapical abscess without sinus: Secondary | ICD-10-CM | POA: Insufficient documentation

## 2018-09-24 MED ORDER — CLINDAMYCIN HCL 150 MG PO CAPS: 450.0000 mg | ORAL_CAPSULE | Freq: Three times a day (TID) | ORAL | 0 refills | Status: DC

## 2018-09-24 MED ORDER — FLUCONAZOLE 150 MG PO TABS: 150.0000 mg | ORAL_TABLET | Freq: Every day | ORAL | 0 refills | Status: AC

## 2018-09-24 NOTE — Discharge Instructions (Addendum)
Please read attached information. If you experience any new or worsening signs or symptoms please return to the emergency room for evaluation. Please follow-up with your primary care provider or specialist as discussed. Please use medication prescribed only as directed and discontinue taking if you have any concerning signs or symptoms.   °

## 2018-09-24 NOTE — ED Provider Notes (Signed)
[facility] EMERGENCY DEPT VIDEO VISIT Provider Note   CSN: [id number] Arrival date & time: 09/24/18  0747    History   Chief Complaint No chief complaint on file.   HPI Kelsey Malone is a 53 y.o. adult.     HPI   Telehealth visit   53 year old patient  today with complaints of dental pain.  He notes 2 days ago he developed pain to his left lower gumline and dentition.  He notes a fractured tooth in this region.  He notes swelling and pain.  No swelling into the neck or the floor the mouth, he notes subjective low-grade fever.  He reports a history of the same that resolved with antibiotic therapy in March.  He does not have a Education officer, communitydentist.      Past Medical History:  Diagnosis Date  . Anxiety   . Arthritis   . DDD (degenerative disc disease), cervical   . Depression   . Fibromyalgia   . Hypertension     There are no active problems to display for this patient.   Past Surgical History:  Procedure Laterality Date  . ABDOMINAL HYSTERECTOMY    . ABDOMINAL SURGERY    . CESAREAN SECTION       OB History   No obstetric history on file.      Home Medications    Prior to Admission medications   Medication Sig Start Date End Date Taking? Authorizing Provider  chlorthalidone (HYGROTON) 25 MG tablet Take 0.5 tablets (12.5 mg total) by mouth daily. 12/14/14   Jennye MoccasinQuigley, Brian S, MD  clindamycin (CLEOCIN) 150 MG capsule Take 3 capsules (450 mg total) by mouth 3 (three) times daily. 09/24/18   Chistine Dematteo, Tinnie GensJeffrey, PA-C  Erythromycin 500 MG TBEC Take 500 mg by mouth 3 (three) times daily. 04/23/18   Fisher, Roselyn BeringSusan W, PA-C  fluconazole (DIFLUCAN) 150 MG tablet Take 1 tablet (150 mg total) by mouth daily for 1 day. 09/24/18 09/25/18  Voncille Simm, Tinnie GensJeffrey, PA-C  ranitidine (ZANTAC) 150 MG tablet Take 150 mg by mouth daily.    [provider]    Family History No family history on file.  Social History Social History   Tobacco Use  . Smoking status: Current Some Day Smoker   Packs/day: 0.50    Types: Cigarettes  . Smokeless tobacco: Never Used  Substance Use Topics  . Alcohol use: Yes  . Drug use: Not on file     Allergies   Paregoric, Opium, Penicillins, and Tramadol   Review of Systems Review of Systems  HENT: Positive for facial swelling.   All other systems reviewed and are negative.    Physical Exam Updated Vital Signs There were no vitals taken for this visit.  Physical Exam HENT:     Head:     Comments: Obvious swelling noted to left jaw, no obvious erythema, jaw full active range of motion, speaking in full sentences in no acute distress     ED Treatments / Results  Labs (all labs ordered are listed, but only abnormal results are displayed) Labs Reviewed - No data to display  EKG None  Radiology No results found.  Procedures Procedures (including critical care time)  Medications Ordered in ED Medications - No data to display   Initial Impression / Assessment and Plan / ED Course  I have reviewed the triage vital signs and the nursing notes.  Pertinent labs & imaging results that were available during my care of the patient were reviewed by me and  considered in my medical decision making (see chart for details).        53 year old female presents today with likely dental infection.  Concern for abscess.  He has used antibiotics which have resolved his symptoms previously.  I discussed the need for in person evaluation if symptoms not improving in 48 hours or if they worsen as he may likely need I&D.  He verbalized understanding and agreement to this plan had no further questions or concerns.  Patient also prescribed a fluconazole tablet for yeast infections as he has had these previously.  Final Clinical Impressions(s) / ED Diagnoses   Final diagnoses:  Dental infection    ED Discharge Orders         Ordered    clindamycin (CLEOCIN) 150 MG capsule  3 times daily     09/24/18 0827    fluconazole (DIFLUCAN) 150  MG tablet  Daily     09/24/18 0827           Okey Regal, PA-C 09/24/18 [place], [place], DO 09/24/18 1149

## 2018-12-02 ENCOUNTER — Other Ambulatory Visit: Payer: Self-pay

## 2018-12-02 ENCOUNTER — Emergency Department: Payer: Self-pay

## 2018-12-02 ENCOUNTER — Emergency Department
Admission: EM | Admit: 2018-12-02 | Discharge: 2018-12-02 | Disposition: A | Discharge status: Home / Self Care | Payer: Self-pay | Attending: Emergency Medicine | Admitting: Emergency Medicine

## 2018-12-02 DIAGNOSIS — Z20822 Contact with and (suspected) exposure to covid-19: Secondary | ICD-10-CM

## 2018-12-02 DIAGNOSIS — R112 Nausea with vomiting, unspecified: Secondary | ICD-10-CM | POA: Insufficient documentation

## 2018-12-02 DIAGNOSIS — I10 Essential (primary) hypertension: Secondary | ICD-10-CM | POA: Insufficient documentation

## 2018-12-02 DIAGNOSIS — Z20828 Contact with and (suspected) exposure to other viral communicable diseases: Secondary | ICD-10-CM | POA: Insufficient documentation

## 2018-12-02 DIAGNOSIS — F17210 Nicotine dependence, cigarettes, uncomplicated: Secondary | ICD-10-CM | POA: Insufficient documentation

## 2018-12-02 DIAGNOSIS — Z79899 Other long term (current) drug therapy: Secondary | ICD-10-CM | POA: Insufficient documentation

## 2018-12-02 HISTORY — DX: Bursopathy, unspecified: M71.9

## 2018-12-02 LAB — CBC WITH DIFFERENTIAL/PLATELET
Abs Immature Granulocytes: 0.07 10*3/uL (ref 0.00–0.07)
Basophils Absolute: 0 10*3/uL (ref 0.0–0.1)
Basophils Relative: 0 %
Eosinophils Absolute: 0 10*3/uL (ref 0.0–0.5)
Eosinophils Relative: 0 %
HCT: 44.4 % (ref 36.0–46.0)
Hemoglobin: 14.9 g/dL (ref 12.0–15.0)
Immature Granulocytes: 1 %
Lymphocytes Relative: 13 %
Lymphs Abs: 1.9 10*3/uL (ref 0.7–4.0)
MCH: 29.9 pg (ref 26.0–34.0)
MCHC: 33.6 g/dL (ref 30.0–36.0)
MCV: 89.2 fL (ref 80.0–100.0)
Monocytes Absolute: 0.5 10*3/uL (ref 0.1–1.0)
Monocytes Relative: 3 %
Neutro Abs: 12.3 10*3/uL — ABNORMAL HIGH (ref 1.7–7.7)
Neutrophils Relative %: 83 %
Platelets: 302 10*3/uL (ref 150–400)
RBC: 4.98 MIL/uL (ref 3.87–5.11)
RDW: 11.8 % (ref 11.5–15.5)
WBC: 14.8 10*3/uL — ABNORMAL HIGH (ref 4.0–10.5)
nRBC: 0 % (ref 0.0–0.2)

## 2018-12-02 LAB — COMPREHENSIVE METABOLIC PANEL
ALT: 48 U/L — ABNORMAL HIGH (ref 0–44)
AST: 24 U/L (ref 15–41)
Albumin: 4.8 g/dL (ref 3.5–5.0)
Alkaline Phosphatase: 92 U/L (ref 38–126)
Anion gap: 15 (ref 5–15)
BUN: 11 mg/dL (ref 6–20)
CO2: 21 mmol/L — ABNORMAL LOW (ref 22–32)
Calcium: 9.8 mg/dL (ref 8.9–10.3)
Chloride: 102 mmol/L (ref 98–111)
Creatinine, Ser: 0.62 mg/dL (ref 0.44–1.00)
GFR calc Af Amer: >60 mL/min (ref >60)
GFR calc non Af Amer: >60 mL/min (ref >60)
Glucose, Bld: 148 mg/dL — ABNORMAL HIGH (ref 70–99)
Potassium: 3.7 mmol/L (ref 3.5–5.1)
Sodium: 138 mmol/L (ref 135–145)
Total Bilirubin: 0.8 mg/dL (ref 0.3–1.2)
Total Protein: 8.5 g/dL — ABNORMAL HIGH (ref 6.5–8.1)

## 2018-12-02 LAB — TROPONIN I (HIGH SENSITIVITY): Troponin I (High Sensitivity): 7 ng/L (ref <18)

## 2018-12-02 LAB — INFLUENZA PANEL BY PCR (TYPE A & B)
Influenza A By PCR: NEGATIVE
Influenza B By PCR: NEGATIVE

## 2018-12-02 LAB — SARS CORONAVIRUS 2 (TAT 6-24 HRS): SARS Coronavirus 2: NEGATIVE

## 2018-12-02 LAB — CK: Total CK: 51 U/L (ref 38–234)

## 2018-12-02 MED ORDER — ONDANSETRON 4 MG PO TBDP: 4.0000 mg | ORAL_TABLET | Freq: Three times a day (TID) | ORAL | 0 refills | Status: DC | PRN

## 2018-12-02 MED ORDER — SODIUM CHLORIDE 0.9 % IV BOLUS
1000.0000 mL | Freq: Once | INTRAVENOUS | Status: AC
  Administered 2018-12-02: 1000 mL via INTRAVENOUS

## 2018-12-02 MED ORDER — ONDANSETRON HCL 4 MG/2ML IJ SOLN
4.0000 mg | Freq: Once | INTRAMUSCULAR | Status: AC
  Administered 2018-12-02: 4 mg via INTRAVENOUS
  Filled 2018-12-02: qty 2

## 2018-12-02 NOTE — ED Provider Notes (Signed)
[facility] Emergency Department Provider Note  ____________________________________________  Time seen: Approximately 4:45 AM  I have reviewed the triage vital signs and the nursing notes.   HISTORY  Chief Complaint Nausea   HPI Kelsey Malone is a 53 y.o. adult with a history of fibromyalgia, hypertension, depression, anxiety, arthritis who presents for evaluation of nausea and vomiting.  Patient reports 24 hours of body aches and fatigue which he has chronically due to fibromyalgia however  patient started vomiting yesterday.  Has had several episodes of nonbloody nonbilious emesis.  No diarrhea, no cough, no chest pain or shortness of breath, no abdominal pain, no fever or chills, no known exposures to COVID.  Patient reports that he feels like he has the flu.  He is complaining of diffuse myalgias.  Past Medical History:  Diagnosis Date   Anxiety    Arthritis    Bursitis    DDD (degenerative disc disease), cervical    Depression    Fibromyalgia    Hypertension     There are no active problems to display for this patient.   Past Surgical History:  Procedure Laterality Date   ABDOMINAL HYSTERECTOMY     ABDOMINAL SURGERY     CESAREAN SECTION      Prior to Admission medications   Medication Sig Start Date End Date Taking? Authorizing Provider  chlorthalidone (HYGROTON) 25 MG tablet Take 0.5 tablets (12.5 mg total) by mouth daily. 12/14/14   Jennye MoccasinQuigley, Brian S, MD  clindamycin (CLEOCIN) 150 MG capsule Take 3 capsules (450 mg total) by mouth 3 (three) times daily. 09/24/18   Hedges, Tinnie GensJeffrey, PA-C  Erythromycin 500 MG TBEC Take 500 mg by mouth 3 (three) times daily. 04/23/18   Sherrie MustacheFisher, Roselyn BeringSusan W, PA-C  ondansetron (ZOFRAN ODT) 4 MG disintegrating tablet Take 1 tablet (4 mg total) by mouth every 8 (eight) hours as needed. 12/02/18   Nita SickleVeronese, [place], MD  ranitidine (ZANTAC) 150 MG tablet Take 150 mg by mouth daily.    [provider]     Allergies Paregoric, Opium, Penicillins, and Tramadol  History reviewed. No pertinent family history.  Social History Social History   Tobacco Use   Smoking status: Current Some Day Smoker    Packs/day: 0.50    Types: Cigarettes   Smokeless tobacco: Never Used  Substance Use Topics   Alcohol use: Yes   Drug use: Yes    Types: Marijuana    Review of Systems  Constitutional: Negative for fever.+ Body aches, myalgias, fatigue Eyes: Negative for visual changes. ENT: Negative for sore throat. Neck: No neck pain  Cardiovascular: Negative for chest pain. Respiratory: Negative for shortness of breath. Gastrointestinal: Negative for abdominal pain or diarrhea. + N/V Genitourinary: Negative for dysuria. Musculoskeletal: Negative for back pain. Skin: Negative for rash. Neurological: Negative for headaches, weakness or numbness. Psych: No SI or HI  ____________________________________________   PHYSICAL EXAM:  VITAL SIGNS: ED Triage Vitals  Enc Vitals Group     BP 12/02/18 0417 (!) 191/124     Pulse Rate 12/02/18 0417 98     Resp 12/02/18 0417 18     Temp 12/02/18 0417 98.3 F (36.8 C)     Temp src --      SpO2 12/02/18 0417 94 %     Weight 12/02/18 0422 300 lb (136.1 kg)     Height 12/02/18 0422 5\' 6"  (1.676 m)     Head Circumference --      Peak Flow --  Pain Score 12/02/18 0420 9     Pain Loc --      Pain Edu? --      Excl. in GC? --     Constitutional: Alert and oriented. Well appearing and in no apparent distress. HEENT:      Head: Normocephalic and atraumatic.         Eyes: Conjunctivae are normal. Sclera is non-icteric.       Mouth/Throat: Mucous membranes are moist.       Neck: Supple with no signs of meningismus. Cardiovascular: Regular rate and rhythm. No murmurs, gallops, or rubs. 2+ symmetrical distal pulses are present in all extremities. No JVD. Respiratory: Normal respiratory effort. Lungs are clear to auscultation bilaterally. No  wheezes, crackles, or rhonchi.  Gastrointestinal: Soft, non tender, and non distended with positive bowel sounds. No rebound or guarding. Genitourinary: No CVA tenderness. Musculoskeletal: Nontender with normal range of motion in all extremities. No edema, cyanosis, or erythema of extremities. Neurologic: Normal speech and language. Face is symmetric. Moving all extremities. No gross focal neurologic deficits are appreciated. Skin: Skin is warm, dry and intact. No rash noted. Psychiatric: Mood and affect are normal. Speech and behavior are normal.  ____________________________________________   LABS (all labs ordered are listed, but only abnormal results are displayed)  Labs Reviewed  CBC WITH DIFFERENTIAL/PLATELET - Abnormal; Notable for the following components:      Result Value   WBC 14.8 (*)    Neutro Abs 12.3 (*)    All other components within normal limits  COMPREHENSIVE METABOLIC PANEL - Abnormal; Notable for the following components:   CO2 21 (*)    Glucose, Bld 148 (*)    Total Protein 8.5 (*)    ALT 48 (*)    All other components within normal limits  SARS CORONAVIRUS 2 (TAT 6-24 HRS)  CK  INFLUENZA PANEL BY PCR (TYPE A & B)  TROPONIN I (HIGH SENSITIVITY)   ____________________________________________  EKG  ED ECG REPORT I, Nita Sickle, the attending physician, personally viewed and interpreted this ECG.  Normal sinus rhythm, rate of 83, normal intervals, normal axis, no ST elevations or depressions.  Normal EKG. ____________________________________________  RADIOLOGY  I have personally reviewed the images performed during this visit and I agree with the Radiologist's read.   Interpretation by Radiologist:  Dg Chest Portable 1 View  Result Date: 12/02/2018 CLINICAL DATA:  Covid like symptoms; Patient has nausea and vomitingneck EXAM: PORTABLE CHEST 1 VIEW COMPARISON:  None. FINDINGS: Normal mediastinum and cardiac silhouette. Normal pulmonary  vasculature. No evidence of effusion, infiltrate, or pneumothorax. No acute bony abnormality. IMPRESSION: No acute cardiopulmonary process. Electronically Signed   By: Genevive Bi M.D.   On: 12/02/2018 05:11      ____________________________________________   PROCEDURES  Procedure(s) performed: None Procedures Critical Care performed:  None ____________________________________________   INITIAL IMPRESSION / ASSESSMENT AND PLAN / ED COURSE   53 y.o. adult with a history of fibromyalgia, hypertension, depression, anxiety, arthritis who presents for evaluation of nausea and vomiting, myalgias, fatigue, and body aches.  Patient is well-appearing in no distress, afebrile.  BP is elevated at 191/124.  Patient has been unable to keep his BP medication over the last 24 hours.  Lungs are clear to auscultation, abdomen is soft, normal work of breathing and normal sats.  Will swab patient for COVID and flu.  We will do a chest x-ray to rule out pneumonia.  Will do CK to rule out rhabdo.  Will check  labs to rule out electrolyte abnormalities, dehydration, AKI.  Will give IV fluids and Zofran.    _________________________ 5:59 AM on 12/02/2018 -----------------------------------------  Labs with no evidence of dehydration, anemia, electrolyte abnormalities, rhabdo.  EKG with no evidence of ischemia.  Patient does have an elevated white count of 14.8.  No abdominal pain or tenderness, no urinary symptoms, no cough.  Flu is negative.  COVID swab is pending.  Discussed quarantine with patient for him and household members until the results of the swab are back.  At this time presentation is concerning for COVID versus a viral process.  Will prescribe Zofran.  Patient is tolerated p.o. with no further episodes of vomiting.  Discussed my standard return precautions and close follow-up with PCP.    As part of my medical decision making, I reviewed the following data within the [place] notes reviewed and incorporated, Labs reviewed , EKG interpreted , Old chart reviewed, Radiograph reviewed , Notes from prior ED visits and [place] Controlled Substance Database   Patient was evaluated in Emergency Department today for the symptoms described in the history of present illness. Patient was evaluated in the context of the global COVID-19 pandemic, which necessitated consideration that the patient might be at risk for infection with the SARS-CoV-2 virus that causes COVID-19. Institutional protocols and algorithms that pertain to the evaluation of patients at risk for COVID-19 are in a state of rapid change based on information released by regulatory bodies including the CDC and federal and state organizations. These policies and algorithms were followed during the patient's care in the ED.   ____________________________________________   FINAL CLINICAL IMPRESSION(S) / ED DIAGNOSES   Final diagnoses:  Non-intractable vomiting with nausea, unspecified vomiting type  Suspected COVID-19 virus infection      NEW MEDICATIONS STARTED DURING THIS VISIT:  ED Discharge Orders         Ordered    ondansetron (ZOFRAN ODT) 4 MG disintegrating tablet  Every 8 hours PRN     12/02/18 0556           Note:  This document was prepared using Dragon voice recognition software and may include unintentional dictation errors.    Rudene Re, MD 12/02/18 [phone]

## 2018-12-02 NOTE — Discharge Instructions (Signed)

## 2018-12-02 NOTE — ED Notes (Signed)
Patient able to consume crackers and ginger ale without vomiting.

## 2018-12-02 NOTE — ED Triage Notes (Signed)
Patient has nausea and vomiting 

## 2019-02-04 ENCOUNTER — Ambulatory Visit (INDEPENDENT_AMBULATORY_CARE_PROVIDER_SITE_OTHER)
Admission: RE | Admit: 2019-02-04 | Discharge: 2019-02-04 | Disposition: A | Discharge status: Home / Self Care | Payer: Self-pay | Source: Ambulatory Visit

## 2019-02-04 DIAGNOSIS — K047 Periapical abscess without sinus: Secondary | ICD-10-CM

## 2019-02-04 MED ORDER — CLINDAMYCIN HCL 300 MG PO CAPS: 300.0000 mg | ORAL_CAPSULE | Freq: Three times a day (TID) | ORAL | 0 refills | Status: AC

## 2019-02-04 NOTE — ED Provider Notes (Signed)
Virtual Visit via Video Note:  Kelsey Malone  initiated request for Telemedicine visit with [facility] team. I connected with Kelsey Malone  on 02/04/2019 at 9:10 AM  for a synchronized telemedicine visit using a video enabled HIPPA compliant telemedicine application. I verified that I am speaking with Kelsey Malone  using two identifiers. Kelsey Hall-Potvin, PA-C  was physically located in a [place] site and Kelsey Malone was located at a different location.   The limitations of evaluation and management by telemedicine as well as the availability of in-person appointments were discussed. Patient was informed that he  may incur a bill ( including co-pay) for this virtual visit encounter. Kelsey Malone  expressed understanding and gave verbal consent to proceed with virtual visit.     History of Present Illness:Kelsey Malone  is a 53 y.o. adult presents with day course of left lower molar pain, swelling.  Patient has history of dental abscesses: Last treated for this 09/24/2018 with clindamycin second amoxicillin allergies.  Endorsing generalized headache, nausea for which he has been taking ondansetron with adequate relief.  No nausea, vomiting, chest pain.  Does endorse halitosis.  No difficulty breathing, swallowing.  States that he has upcoming appointment with dental school in [place] in January: Intends to keep this.  Review of Systems  Constitutional: Negative for fever and malaise/fatigue.  HENT: Negative for ear pain and sore throat.        Positive for dental problem  Eyes: Negative for pain and discharge.  Respiratory: Negative for cough and shortness of breath.   Cardiovascular: Negative for chest pain and palpitations.  Gastrointestinal: Positive for nausea. Negative for abdominal pain, diarrhea and vomiting.  Musculoskeletal: Negative for joint pain and myalgias.  Skin: Negative for itching and rash.    Past Medical  History:  Diagnosis Date  . Anxiety   . Arthritis   . Bursitis   . DDD (degenerative disc disease), cervical   . Depression   . Fibromyalgia   . Hypertension     Allergies  Allergen Reactions  . Paregoric Other (See Comments)    hallucinations  . Opium Other (See Comments)    Reaction: hallucinations  . Penicillins Hives, Nausea And Vomiting and Swelling    Has patient had a PCN reaction causing immediate rash, facial/tongue/throat swelling, SOB or lightheadedness with hypotension: Yes Has patient had a PCN reaction causing severe rash involving mucus membranes or skin necrosis: No Has patient had a PCN reaction that required hospitalization No Has patient had a PCN reaction occurring within the last 10 years: No If all of the above answers are "NO", then may proceed with Cephalosporin use.   . Tramadol Other (See Comments)    "Hallucinations"        Observations/Objective: 53 y.o. female sitting in no acute distress.  Patient is able to speak in full sentences without coughing, sneezing, wheezing.  Assessment and Plan: We will treat for dental abscess with clindamycin.  Patient to continue home ondansetron, follow-up with dental office next calendar year.  Return precautions discussed, patient verbalized understanding and is agreeable to plan.  Follow Up Instructions: Patient to seek in-person evaluation for persistent, worsening symptoms including increased pain, swelling, fever, difficulty swallowing, chest pain.   I discussed the assessment and treatment plan with the patient. The patient was provided an opportunity to ask questions and all were answered. The patient agreed with the plan and demonstrated an understanding of  the instructions.   The patient was advised to call back or seek an in-person evaluation if the symptoms worsen or if the condition fails to improve as anticipated.  I provided 15 minutes of non-face-to-face time during this encounter.    [place]  Hall-Potvin, PA-C  02/04/2019 9:10 AM        Malone, [place], PA-C 02/04/19 [phone]

## 2019-02-04 NOTE — Discharge Instructions (Addendum)
Go to GoodRx.com for additional prescription discounts.

## 2019-03-09 ENCOUNTER — Ambulatory Visit (INDEPENDENT_AMBULATORY_CARE_PROVIDER_SITE_OTHER)
Admission: RE | Admit: 2019-03-09 | Discharge: 2019-03-09 | Disposition: A | Discharge status: Home / Self Care | Payer: Self-pay | Source: Ambulatory Visit

## 2019-03-09 ENCOUNTER — Telehealth: Payer: Self-pay

## 2019-03-09 DIAGNOSIS — K029 Dental caries, unspecified: Secondary | ICD-10-CM

## 2019-03-09 DIAGNOSIS — K047 Periapical abscess without sinus: Secondary | ICD-10-CM

## 2019-03-09 MED ORDER — ACETAMINOPHEN 500 MG PO TABS: 500.0000 mg | ORAL_TABLET | Freq: Four times a day (QID) | ORAL | 0 refills | Status: DC | PRN

## 2019-03-09 MED ORDER — CLINDAMYCIN HCL 150 MG PO CAPS: 150.0000 mg | ORAL_CAPSULE | Freq: Four times a day (QID) | ORAL | 0 refills | Status: AC

## 2019-03-09 MED ORDER — CHLORHEXIDINE GLUCONATE 0.12 % MT SOLN: 15.0000 mL | Freq: Two times a day (BID) | OROMUCOSAL | 0 refills | Status: DC

## 2019-03-09 NOTE — ED Provider Notes (Addendum)
. Virtual Visit via Video Note:  MYREL RAPPLEYE  initiated request for Telemedicine visit with [facility] team. I connected with [name]  on 03/09/2019 at 845  for a synchronized telemedicine visit using a video enabled HIPPA compliant telemedicine application. I verified that I am speaking with [name]  using two identifiers. Emerson Monte, FNP  was physically located in a [facility] Urgent care site and LEILANNI HALVORSON was located at a different location.   The limitations of evaluation and management by telemedicine as well as the availability of in-person appointments were discussed. Patient was informed that he  may incur a bill ( including co-pay) for this virtual visit encounter. [name]  expressed understanding and gave verbal consent to proceed with virtual visit.     History of Present Illness:Kelsey Malone  is a 54 y.o. adult presents  with dental pain for the past 3-4 days.  Denies a precipitating event or trauma.  Localizes pain to his left lower molar and left lower gum.  Has tried OTC analgesics without relief.  Worse with chewing.  Does not see a dentist regularly.  Reports similar symptoms in the past that improved with 14 days treatment with clindamycin.  Denies fever, chills, dysphagia, odynophagia, oral or neck swelling, nausea, vomiting, chest pain, SOB.    Past Medical History:  Diagnosis Date  . Anxiety   . Arthritis   . Bursitis   . DDD (degenerative disc disease), cervical   . Depression   . Fibromyalgia   . Hypertension     Allergies  Allergen Reactions  . Paregoric Other (See Comments)    hallucinations  . Opium Other (See Comments)    Reaction: hallucinations  . Penicillins Hives, Nausea And Vomiting and Swelling    Has patient had a PCN reaction causing immediate rash, facial/tongue/throat swelling, SOB or lightheadedness with hypotension: Yes Has patient had a PCN reaction causing severe rash  involving mucus membranes or skin necrosis: No Has patient had a PCN reaction that required hospitalization No Has patient had a PCN reaction occurring within the last 10 years: No If all of the above answers are "NO", then may proceed with Cephalosporin use.   . Tramadol Other (See Comments)    "Hallucinations"        Observations/Objective:VITALS: Per patient if applicable, see vitals. GENERAL: Alert, appears well and in no acute distress. HEENT: Atraumatic, conjunctiva clear, no obvious abnormalities on inspection of external nose and ears. NECK: Normal movements of the head and neck. CARDIOPULMONARY: No increased WOB. Speaking in clear sentences. I:E ratio WNL.  MS: Moves all visible extremities without noticeable abnormality. PSYCH: Pleasant and cooperative, well-groomed. Speech normal rate and rhythm. Affect is appropriate. Insight and judgement are appropriate. Attention is focused, linear, and appropriate.  NEURO: CN grossly intact. Oriented as arrived to appointment on time with no prompting. Moves both UE equally.  SKIN: No obvious lesions, wounds,  or cyanosis noted on face or hands.     Assessment and Plan:  We will treat patient for dental abscess that developed  from dental caries Clindamycin prescribed for dental abscess Tylenol for pain Chlorhexidine mouthwash  Patient was advised to take medication as prescribed and to completion Patient verbalized understanding of the plan of care   Follow Up Instructions: Advised patient to follow-up with a dentist as soon as possible for further evaluation. To seek emergent care for worsening of symptom   I discussed the  assessment and treatment plan with the patient. The patient was provided an opportunity to ask questions and all were answered. The patient agreed with the plan and demonstrated an understanding of the instructions.   The patient was advised to call back or seek an in-person evaluation if the symptoms worsen  or if the condition fails to improve as anticipated.  I provided 15 minutes of non-face-to-face time during this encounter.    Durward Parcel, FNP  03/09/2019 0900         Durward Parcel, FNP 03/09/19 0908    Durward Parcel, FNP 03/09/19 0919    Durward Parcel, FNP 03/09/19 [phone]

## 2019-03-09 NOTE — Discharge Instructions (Signed)
Chlorhexidine prescribed for mouthwash Ibuprofen prescribed. Use as directed for pain relief Clindamycin prescribed.  Take as directed and to completion Recommend a soft diet  Maintain proper dental hygiene Follow up with dentist as soon as possible for further evaluation and treatment

## 2020-12-02 ENCOUNTER — Telehealth: Payer: Self-pay | Admitting: Physician Assistant

## 2020-12-02 DIAGNOSIS — K047 Periapical abscess without sinus: Secondary | ICD-10-CM

## 2020-12-02 MED ORDER — CLINDAMYCIN HCL 300 MG PO CAPS: 300.0000 mg | ORAL_CAPSULE | Freq: Three times a day (TID) | ORAL | 0 refills | Status: AC

## 2020-12-02 NOTE — Patient Instructions (Signed)
  Velora Mediate, thank you for joining Piedad Climes, PA-C for today's virtual visit.  While this provider is not your primary care provider (PCP), if your PCP is located in our provider database this encounter information will be shared with them immediately following your visit.  Consent: (Patient) Kelsey Malone provided verbal consent for this virtual visit at the beginning of the encounter.  Current Medications:  Current Outpatient Medications:    acetaminophen (TYLENOL) 500 MG tablet, Take 1 tablet (500 mg total) by mouth every 6 (six) hours as needed., Disp: 30 tablet, Rfl: 0   chlorhexidine (PERIDEX) 0.12 % solution, Use as directed 15 mLs in the mouth or throat 2 (two) times daily., Disp: 120 mL, Rfl: 0   chlorthalidone (HYGROTON) 25 MG tablet, Take 0.5 tablets (12.5 mg total) by mouth daily., Disp: 30 tablet, Rfl: 1   Erythromycin 500 MG TBEC, Take 500 mg by mouth 3 (three) times daily., Disp: 21 tablet, Rfl: 0   ondansetron (ZOFRAN ODT) 4 MG disintegrating tablet, Take 1 tablet (4 mg total) by mouth every 8 (eight) hours as needed., Disp: 20 tablet, Rfl: 0   ranitidine (ZANTAC) 150 MG tablet, Take 150 mg by mouth daily., Disp: , Rfl:    Medications ordered in this encounter:  No orders of the defined types were placed in this encounter.    *If you need refills on other medications prior to your next appointment, please contact your pharmacy*  Follow-Up: Call back or seek an in-person evaluation if the symptoms worsen or if the condition fails to improve as anticipated.  Other Instructions Please take the antibiotic as directed with food. Start a daily probiotic. Keep well-hydrated. Continue tylenol for pain. You can use topical Orajel. I recommend OTC Peroxyl mouthwash 2-3 x daily to help promote healing.   IF not resolving, you need an in-person evaluation ASAP. See link below.    If you have been instructed to have an in-person evaluation today at a local  Urgent Care facility, please use the link below. It will take you to a list of all of our available [facility], including address, phone number and hours of operation. Please do not delay care.  [facility]  If you or a family member do not have a primary care provider, use the link below to schedule a visit and establish care. When you choose a [facility] primary care physician or advanced practice provider, you gain a long-term partner in health. Find a Primary Care Provider  Learn more about [facility] in-office and virtual care options: [facility] - Get Care Now

## 2020-12-02 NOTE — Progress Notes (Addendum)
Virtual Visit Consent   Kelsey Malone, you are scheduled for a virtual visit with a [facility] provider today.     Just as with appointments in the office, your consent must be obtained to participate.  Your consent will be active for this visit and any virtual visit you may have with one of our providers in the next 365 days.     If you have a MyChart account, a copy of this consent can be sent to you electronically.  All virtual visits are billed to your insurance company just like a traditional visit in the office.    As this is a virtual visit, video technology does not allow for your provider to perform a traditional examination.  This may limit your provider's ability to fully assess your condition.  If your provider identifies any concerns that need to be evaluated in person or the need to arrange testing (such as labs, EKG, etc.), we will make arrangements to do so.     Although advances in technology are sophisticated, we cannot ensure that it will always work on either your end or our end.  If the connection with a video visit is poor, the visit may have to be switched to a telephone visit.  With either a video or telephone visit, we are not always able to ensure that we have a secure connection.     I need to obtain your verbal consent now.   Are you willing to proceed with your visit today?    Kelsey Malone has provided verbal consent on 12/02/2020 for a virtual visit (video or telephone).   Piedad Climes, [place]   Date: 12/02/2020 8:18 AM   Virtual Visit via Video Note   I, Piedad Climes, connected with  Kelsey Malone  ([id number], 08/11/1965) on 12/02/20 at  8:15 AM EDT by a video-enabled telemedicine application and verified that I am speaking with the correct person using two identifiers.  Location: Patient: Virtual Visit Location Patient: Home Provider: Virtual Visit Location Provider: Home Office   I discussed the limitations of evaluation and  management by telemedicine and the availability of in person appointments. The patient expressed understanding and agreed to proceed.    History of Present Illness: Kelsey Malone is a 55 y.o. 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 Left R molar cracked for some time. Unable to see a dentist currently due to lack of insurance. Notes he irritated the area with some harder foods. Now with gingival swelling and pain in the area. Notes some drainage from the area. Has history of abscess and this seems identical to previous issue. Denies fever or decreased ROM of jaw. Notes some tenderness in lymph nodes.   HPI: HPI  Problems: There are no problems to display for this patient.   Allergies:  Allergies  Allergen Reactions   Paregoric Other (See Comments)    hallucinations   Opium Other (See Comments)    Reaction: hallucinations   Penicillins Hives, Nausea And Vomiting and Swelling    Has patient had a PCN reaction causing immediate rash, facial/tongue/throat swelling, SOB or lightheadedness with hypotension: Yes Has patient had a PCN reaction causing severe rash involving mucus membranes or skin necrosis: No Has patient had a PCN reaction that required hospitalization No Has patient had a PCN reaction occurring within the last 10 years: No If all of the  above answers are "NO", then may proceed with Cephalosporin use.    Tramadol Other (See Comments)    "Hallucinations"   Medications:  Current Outpatient Medications:    clindamycin (CLEOCIN) 300 MG capsule, Take 1 capsule (300 mg total) by mouth 3 (three) times daily for 10 days., Disp: 30 capsule, Rfl: 0   acetaminophen (TYLENOL) 500 MG tablet, Take 1 tablet (500 mg total) by mouth every 6 (six) hours as needed., Disp: 30 tablet, Rfl: 0  Observations/Objective: Patient is well-developed, well-nourished in no acute distress.  Resting  comfortably at home.  Head is normocephalic, atraumatic.  No labored breathing. Speech is clear and coherent with logical content.  Patient is alert and oriented at baseline.  Able to move jaw without resistance  Assessment and Plan: 1. Dental abscess - clindamycin (CLEOCIN) 300 MG capsule; Take 1 capsule (300 mg total) by mouth 3 (three) times daily for 10 days.  Dispense: 30 capsule; Refill: 0 + history. No alarm signs/symptoms present. Is penicillin-allergic. Rx Clindamycin 300 mg TID x 10 days. Start daily probiotic and yogurt intake. In-person evaluation for any non-resolving, new or worsening symptoms. Recommend he reach out to local dental providers.  Follow Up Instructions: I discussed the assessment and treatment plan with the patient. The patient was provided an opportunity to ask questions and all were answered. The patient agreed with the plan and demonstrated an understanding of the instructions.  A copy of instructions were sent to the patient via MyChart unless otherwise noted below.    The patient was advised to call back or seek an in-person evaluation if the symptoms worsen or if the condition fails to improve as anticipated.  Time:  I spent 12 minutes with the patient via telehealth technology discussing the above problems/concerns.    Piedad Climes, PA-C

## 2021-01-12 ENCOUNTER — Telehealth: Payer: Self-pay | Admitting: Physician Assistant

## 2021-01-12 DIAGNOSIS — R3989 Other symptoms and signs involving the genitourinary system: Secondary | ICD-10-CM

## 2021-01-12 MED ORDER — SULFAMETHOXAZOLE-TRIMETHOPRIM 800-160 MG PO TABS: 1.0000 | ORAL_TABLET | Freq: Two times a day (BID) | ORAL | 0 refills | Status: DC

## 2021-01-12 NOTE — Patient Instructions (Signed)
Velora Mediate, thank you for joining Margaretann Loveless, PA-C for today's virtual visit.  While this provider is not your primary care provider (PCP), if your PCP is located in our provider database this encounter information will be shared with them immediately following your visit.  Consent: (Patient) Kelsey Malone provided verbal consent for this virtual visit at the beginning of the encounter.  Current Medications:  Current Outpatient Medications:    sulfamethoxazole-trimethoprim (BACTRIM DS) 800-160 MG tablet, Take 1 tablet by mouth 2 (two) times daily., Disp: 10 tablet, Rfl: 0   acetaminophen (TYLENOL) 500 MG tablet, Take 1 tablet (500 mg total) by mouth every 6 (six) hours as needed., Disp: 30 tablet, Rfl: 0   Medications ordered in this encounter:  Meds ordered this encounter  Medications   sulfamethoxazole-trimethoprim (BACTRIM DS) 800-160 MG tablet    Sig: Take 1 tablet by mouth 2 (two) times daily.    Dispense:  10 tablet    Refill:  0    Order Specific Question:   Supervising Provider    Answer:   Hyacinth Meeker, BRIAN [[id number]]     *If you need refills on other medications prior to your next appointment, please contact your pharmacy*  Follow-Up: Call back or seek an in-person evaluation if the symptoms worsen or if the condition fails to improve as anticipated.  Other Instructions Urinary Tract Infection, Adult A urinary tract infection (UTI) is an infection of any part of the urinary tract. The urinary tract includes: The kidneys. The ureters. The bladder. The urethra. These organs make, store, and get rid of pee (urine) in the body. What are the causes? This infection is caused by germs (bacteria) in your genital area. These germs grow and cause swelling (inflammation) of your urinary tract. What increases the risk? The following factors may make you more likely to develop this condition: Using a small, thin tube (catheter) to drain pee. Not being able to  control when you pee or poop (incontinence). Being female. If you are female, these things can increase the risk: Using these methods to prevent pregnancy: A medicine that kills sperm (spermicide). A device that blocks sperm (diaphragm). Having low levels of a female hormone (estrogen). Being pregnant. You are more likely to develop this condition if: You have genes that add to your risk. You are sexually active. You take antibiotic medicines. You have trouble peeing because of: A prostate that is bigger than normal, if you are female. A blockage in the part of your body that drains pee from the bladder. A kidney stone. A nerve condition that affects your bladder. Not getting enough to drink. Not peeing often enough. You have other conditions, such as: Diabetes. A weak disease-fighting system (immune system). Sickle cell disease. Gout. Injury of the spine. What are the signs or symptoms? Symptoms of this condition include: Needing to pee right away. Peeing small amounts often. Pain or burning when peeing. Blood in the pee. Pee that smells bad or not like normal. Trouble peeing. Pee that is cloudy. Fluid coming from the vagina, if you are female. Pain in the belly or lower back. Other symptoms include: Vomiting. Not feeling hungry. Feeling mixed up (confused). This may be the first symptom in older adults. Being tired and grouchy (irritable). A fever. Watery poop (diarrhea). How is this treated? Taking antibiotic medicine. Taking other medicines. Drinking enough water. In some cases, you may need to see a specialist. Follow these instructions at home: Medicines Take over-the-counter and prescription medicines  only as told by your doctor. If you were prescribed an antibiotic medicine, take it as told by your doctor. Do not stop taking it even if you start to feel better. General instructions Make sure you: Pee until your bladder is empty. Do not hold pee for a long  time. Empty your bladder after sex. Wipe from front to back after peeing or pooping if you are a female. Use each tissue one time when you wipe. Drink enough fluid to keep your pee pale yellow. Keep all follow-up visits. Contact a doctor if: You do not get better after 1-2 days. Your symptoms go away and then come back. Get help right away if: You have very bad back pain. You have very bad pain in your lower belly. You have a fever. You have chills. You feeling like you will vomit or you vomit. Summary A urinary tract infection (UTI) is an infection of any part of the urinary tract. This condition is caused by germs in your genital area. There are many risk factors for a UTI. Treatment includes antibiotic medicines. Drink enough fluid to keep your pee pale yellow. This information is not intended to replace advice given to you by your health care provider. Make sure you discuss any questions you have with your health care provider. Document Revised: 09/20/2019 Document Reviewed: 09/20/2019 Elsevier Patient Education  2022 [employer].    If you have been instructed to have an in-person evaluation today at a local Urgent Care facility, please use the link below. It will take you to a list of all of our available [facility], including address, phone number and hours of operation. Please do not delay care.  [facility]  If you or a family member do not have a primary care provider, use the link below to schedule a visit and establish care. When you choose a [place] primary care physician or advanced practice provider, you gain a long-term partner in health. Find a Primary Care Provider  Learn more about [facility]'s in-office and virtual care options:  - Get Care Now

## 2021-01-12 NOTE — Progress Notes (Signed)
Virtual Visit Consent   Kelsey Malone, you are scheduled for a virtual visit with a [facility] provider today.     Just as with appointments in the office, your consent must be obtained to participate.  Your consent will be active for this visit and any virtual visit you may have with one of our providers in the next 365 days.     If you have a MyChart account, a copy of this consent can be sent to you electronically.  All virtual visits are billed to your insurance company just like a traditional visit in the office.    As this is a virtual visit, video technology does not allow for your provider to perform a traditional examination.  This may limit your provider's ability to fully assess your condition.  If your provider identifies any concerns that need to be evaluated in person or the need to arrange testing (such as labs, EKG, etc.), we will make arrangements to do so.     Although advances in technology are sophisticated, we cannot ensure that it will always work on either your end or our end.  If the connection with a video visit is poor, the visit may have to be switched to a telephone visit.  With either a video or telephone visit, we are not always able to ensure that we have a secure connection.     I need to obtain your verbal consent now.   Are you willing to proceed with your visit today?    Kelsey Malone has provided verbal consent on 01/12/2021 for a virtual visit (video or telephone).   Margaretann Loveless, PA-C   Date: 01/12/2021 8:55 AM   Virtual Visit via Video Note   I, Margaretann Loveless, connected with  Kelsey Malone  ([id number], 24-Aug-1965) on 01/12/21 at  9:00 AM EST by a video-enabled telemedicine application and verified that I am speaking with the correct person using two identifiers.  Location: Patient: Virtual Visit Location Patient: Home Provider: Virtual Visit Location Provider: Home Office   I discussed the limitations of evaluation and  management by telemedicine and the availability of in person appointments. The patient expressed understanding and agreed to proceed.    History of Present Illness: Kelsey Malone is a 55 y.o. who identifies as a transgender female who was assigned adult at birth, and is being seen today for suspected uti.  HPI: Urinary Tract Infection  This is a new problem. The current episode started in the past 7 days. The problem has been gradually worsening. The quality of the pain is described as aching. The pain is mild. There has been no fever. Associated symptoms include frequency, hesitancy and urgency. Pertinent negatives include no chills, flank pain, hematuria, nausea or vomiting. Associated symptoms comments: Suprapubic pressure . He has tried increased fluids for the symptoms. The treatment provided no relief. His past medical history is significant for recurrent UTIs.     Problems: There are no problems to display for this patient.   Allergies:  Allergies  Allergen Reactions   Paregoric Other (See Comments)    hallucinations   Opium Other (See Comments)    Reaction: hallucinations   Penicillins Hives, Nausea And Vomiting and Swelling    Has patient had a PCN reaction causing immediate rash, facial/tongue/throat swelling, SOB or lightheadedness with hypotension: Yes Has patient had a PCN reaction causing severe rash involving mucus membranes or skin necrosis: No Has patient had a PCN reaction that  required hospitalization No Has patient had a PCN reaction occurring within the last 10 years: No If all of the above answers are "NO", then may proceed with Cephalosporin use.    Tramadol Other (See Comments)    "Hallucinations"   Medications:  Current Outpatient Medications:    sulfamethoxazole-trimethoprim (BACTRIM DS) 800-160 MG tablet, Take 1 tablet by mouth 2 (two) times daily., Disp: 10 tablet, Rfl: 0   acetaminophen (TYLENOL) 500 MG tablet, Take 1 tablet (500 mg total) by mouth  every 6 (six) hours as needed., Disp: 30 tablet, Rfl: 0  Observations/Objective: Patient is well-developed, well-nourished in no acute distress.  Resting comfortably at home.  Head is normocephalic, atraumatic.  No labored breathing. Speech is clear and coherent with logical content.  Patient is alert and oriented at baseline.    Assessment and Plan: 1. Suspected UTI - sulfamethoxazole-trimethoprim (BACTRIM DS) 800-160 MG tablet; Take 1 tablet by mouth 2 (two) times daily.  Dispense: 10 tablet; Refill: 0  -Worsening symptoms.  - Will treat empirically with Bactrim - Continue to push fluids.  - He is to call or seek in person evaluation if symptoms do not improve or if they worsen.    Follow Up Instructions: I discussed the assessment and treatment plan with the patient. The patient was provided an opportunity to ask questions and all were answered. The patient agreed with the plan and demonstrated an understanding of the instructions.  A copy of instructions were sent to the patient via MyChart unless otherwise noted below.    The patient was advised to call back or seek an in-person evaluation if the symptoms worsen or if the condition fails to improve as anticipated.  Time:  I spent 10 minutes with the patient via telehealth technology discussing the above problems/concerns.    Mar Daring, PA-C

## 2021-04-26 IMAGING — DX DG CHEST 1V PORT
1 series · 1 of 1 positions shown · non-contrast
Comparison: None.

CLINICAL DATA: Covid like symptoms; Patient has nausea and
vomitingneck

EXAM:
PORTABLE CHEST 1 VIEW

[chest ap]
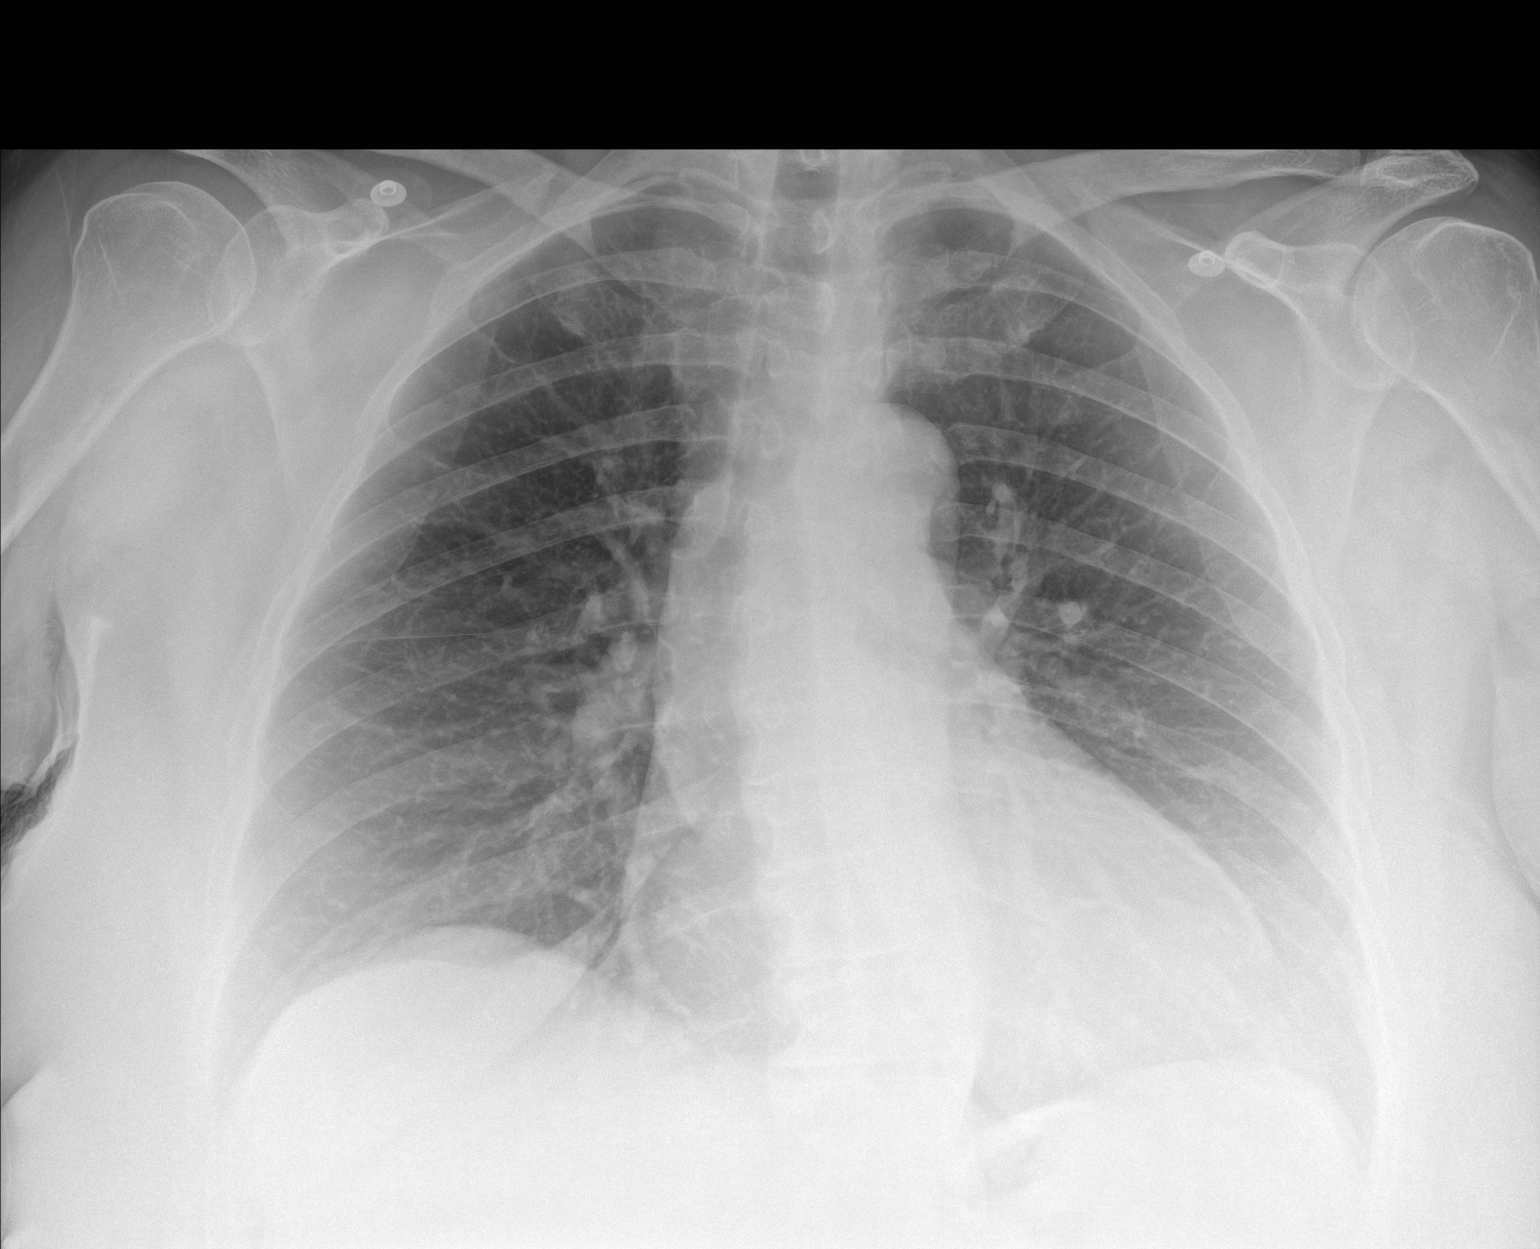

[1 of 1 positions shown; findings below may reference images not displayed]

FINDINGS: Normal mediastinum and cardiac silhouette. Normal pulmonary
vasculature. No evidence of effusion, infiltrate, or pneumothorax.
No acute bony abnormality.
IMPRESSION: No acute cardiopulmonary process.

## 2021-06-05 ENCOUNTER — Telehealth: Payer: Self-pay | Admitting: Physician Assistant

## 2021-06-05 ENCOUNTER — Encounter: Payer: Self-pay | Admitting: Physician Assistant

## 2021-06-05 DIAGNOSIS — K047 Periapical abscess without sinus: Secondary | ICD-10-CM

## 2021-06-05 MED ORDER — CLINDAMYCIN HCL 300 MG PO CAPS: 300.0000 mg | ORAL_CAPSULE | Freq: Three times a day (TID) | ORAL | 0 refills | Status: AC

## 2021-06-05 NOTE — Progress Notes (Signed)
?Virtual Visit Consent  ? ?Kelsey Malone, you are scheduled for a virtual visit with a [facility] provider today.   ?  ?Just as with appointments in the office, your consent must be obtained to participate.  Your consent will be active for this visit and any virtual visit you may have with one of our providers in the next 365 days.   ?  ?If you have a MyChart account, a copy of this consent can be sent to you electronically.  All virtual visits are billed to your insurance company just like a traditional visit in the office.   ? ?As this is a virtual visit, video technology does not allow for your provider to perform a traditional examination.  This may limit your provider's ability to fully assess your condition.  If your provider identifies any concerns that need to be evaluated in person or the need to arrange testing (such as labs, EKG, etc.), we will make arrangements to do so.   ?  ?Although advances in technology are sophisticated, we cannot ensure that it will always work on either your end or our end.  If the connection with a video visit is poor, the visit may have to be switched to a telephone visit.  With either a video or telephone visit, we are not always able to ensure that we have a secure connection.    ? ?I need to obtain your verbal consent now.   Are you willing to proceed with your visit today?  ?  ?Kelsey Malone has provided verbal consent on 06/05/2021 for a virtual visit (video or telephone). ?  ?Roney Jaffe, PA-C  ? ?Date: 06/05/2021 8:44 AM ? ? ?Virtual Visit via Video Note  ? ?I, Kelsey Malone, connected with  Kelsey Malone  ([id number], [birth year]) on 06/05/21 at  8:30 AM EDT by a video-enabled telemedicine application and verified that I am speaking with the correct person using two identifiers. ? ?Location: ?Patient: Virtual Visit Location Patient: Home ?Provider: Virtual Visit Location Provider: Home Office ?  ?I discussed the limitations of evaluation and management by  telemedicine and the availability of in person appointments. The patient expressed understanding and agreed to proceed.   ? ?History of Present Illness: ?Kelsey Malone is a 56 y.o. who identifies as a transgender female who was assigned adult at birth, and is being seen today for possible dental abscess.   ? ?States that he believes that he aggravated his left right molar cracked tooth by "eating things he should not have" and states that he has once again had some swelling, foul smell, and pain in that area.  States that he has not been able to follow-up with dentistry due to financial constraints.  Does also states that a friend went to the dental clinic in [facility] and he is scared to go there out of fear the pain would be worse. ? ?Does not have a primary care provider. ? ?Denies fever, chills, nausea, vomiting. ?No antibiotic use in the last 30 days. ? ? ?HPI: HPI  ?Problems: There are no problems to display for this patient. ?  ?Allergies:  ?Allergies  ?Allergen Reactions  ? Paregoric Other (See Comments)  ?  hallucinations  ? Opium Other (See Comments)  ?  Reaction: hallucinations  ? Penicillins Hives, Nausea And Vomiting and Swelling  ?  Has patient had a PCN reaction causing immediate rash, facial/tongue/throat swelling, SOB or lightheadedness with hypotension: Yes ?Has patient had  a PCN reaction causing severe rash involving mucus membranes or skin necrosis: No ?Has patient had a PCN reaction that required hospitalization No ?Has patient had a PCN reaction occurring within the last 10 years: No ?If all of the above answers are "NO", then may proceed with Cephalosporin use. ?  ? Tramadol Other (See Comments)  ?  "Hallucinations"  ? ?Medications:  ?Current Outpatient Medications:  ?  clindamycin (CLEOCIN) 300 MG capsule, Take 1 capsule (300 mg total) by mouth 3 (three) times daily for 10 days., Disp: 30 capsule, Rfl: 0 ?  acetaminophen (TYLENOL) 500 MG tablet, Take 1 tablet (500 mg total) by mouth  every 6 (six) hours as needed., Disp: 30 tablet, Rfl: 0 ?  sulfamethoxazole-trimethoprim (BACTRIM DS) 800-160 MG tablet, Take 1 tablet by mouth 2 (two) times daily., Disp: 10 tablet, Rfl: 0 ? ?Observations/Objective: ?Patient is well-developed, well-nourished in no acute distress.  ?Resting comfortably  at home.  ?Head is normocephalic, atraumatic.  ?No labored breathing.  ?Speech is clear and coherent with logical content.  ?Patient is alert and oriented at baseline.  ? ? ?Assessment and Plan: ?1. Dental abscess ?- clindamycin (CLEOCIN) 300 MG capsule; Take 1 capsule (300 mg total) by mouth 3 (three) times daily for 10 days.  Dispense: 30 capsule; Refill: 0 ? ? ?Assessment and Plan: ?1. Dental abscess ?- clindamycin (CLEOCIN) 300 MG capsule; Take 1 capsule (300 mg total) by mouth 3 (three) times daily for 10 days.  Dispense: 30 capsule; Refill: 0 ?+ history. No alarm signs/symptoms present. Is penicillin-allergic. Rx Clindamycin 300 mg TID x 10 days. Start daily probiotic and yogurt intake. In-person evaluation for any non-resolving, new or worsening symptoms.  ? ?Patient was given information on how to connect with community care clinics to establish care as well as work on obtaining [employer] financial assistance. ? ? ? ?The patient was advised to call back or seek an in-person evaluation if the symptoms worsen or if the condition fails to improve as anticipated. ? ?Time:  ?I spent 20 minutes with the patient via telehealth technology discussing the above problems/concerns.   ? ?Kelsey Woolford S Mayers, PA-C ? ?

## 2021-06-05 NOTE — Patient Instructions (Signed)
?[name], thank you for joining Kennieth Rad, PA-C for today's virtual visit.  While this provider is not your primary care provider (PCP), if your PCP is located in our provider database this encounter information will be shared with them immediately following your visit. ? ?Consent: ?(Patient) Kelsey Malone provided verbal consent for this virtual visit at the beginning of the encounter. ? ?Current Medications: ? ?Current Outpatient Medications:  ?  clindamycin (CLEOCIN) 300 MG capsule, Take 1 capsule (300 mg total) by mouth 3 (three) times daily for 10 days., Disp: 30 capsule, Rfl: 0 ?  acetaminophen (TYLENOL) 500 MG tablet, Take 1 tablet (500 mg total) by mouth every 6 (six) hours as needed., Disp: 30 tablet, Rfl: 0 ?  sulfamethoxazole-trimethoprim (BACTRIM DS) 800-160 MG tablet, Take 1 tablet by mouth 2 (two) times daily., Disp: 10 tablet, Rfl: 0  ? ?Medications ordered in this encounter:  ?Meds ordered this encounter  ?Medications  ? clindamycin (CLEOCIN) 300 MG capsule  ?  Sig: Take 1 capsule (300 mg total) by mouth 3 (three) times daily for 10 days.  ?  Dispense:  30 capsule  ?  Refill:  0  ?  Order Specific Question:   Supervising Provider  ?  Answer:   Noemi Chapel [[id number]]  ?  ? ?*If you need refills on other medications prior to your next appointment, please contact your pharmacy* ? ?Follow-Up: ?Call back or seek an in-person evaluation if the symptoms worsen or if the condition fails to improve as anticipated. ? ?Other Instructions ?I do encourage you to start taking a probiotic or eat yogurt on a daily basis while you are taking this antibiotic. ? ?I strongly encourage you to follow-up with dentistry as soon as you are able. ? ?To help you get established with a primary care provider and start the process to apply for [place] financial assistance, you can call this number to make an appointment.  This is for Primary Care at [facility] ? ?[phone] ? ? ?If you have been instructed  to have an in-person evaluation today at a local Urgent Care facility, please use the link below. It will take you to a list of all of our available [facility], including address, phone number and hours of operation. Please do not delay care.  ?[facility] ? ?If you or a family member do not have a primary care provider, use the link below to schedule a visit and establish care. When you choose a [facility] primary care physician or advanced practice provider, you gain a long-term partner in health. ?Find a Primary Care Provider ? ?Learn more about [facility]'s in-office and virtual care options: ?[facility] Now ? ?Dental Abscess ? ?A dental abscess is an infection around a tooth that may involve pain, swelling, and a collection of pus, as well as other symptoms. Treatment is important to help with symptoms and to prevent the infection from spreading. ?The general types of dental abscesses are: ?Pulpal abscess. This abscess may form from the inner part of the tooth (pulp). ?Periodontal abscess. This abscess may form from the gum. ?What are the causes? ?This condition is caused by a bacterial infection in or around the tooth. It may result from: ?Severe tooth decay (cavities). ?Trauma to the tooth, such as a broken or chipped tooth. ?What increases the risk? ?This condition is more likely to develop in males. It is also more likely to develop in people  who: ?Have cavities. ?Have severe gum disease. ?Eat sugary snacks between meals. ?Use tobacco products. ?Have diabetes. ?Have a weakened disease-fighting system (immune system). ?Do not brush and care for their teeth regularly. ?What are the signs or symptoms? ?Mild symptoms of this condition include: ?Tenderness. ?Bad breath. ?Fever. ?A bitter taste in the mouth. ?Pain in and around the infected tooth. ?Moderate symptoms of this condition include: ?Swollen neck glands. ?Chills. ?Pus drainage. ?Swelling and redness around the  infected tooth, in the mouth, or in the face. ?Severe pain in and around the infected tooth. ?Severe symptoms of this condition include: ?Difficulty swallowing. ?Difficulty opening the mouth. ?Nausea. ?Vomiting. ?How is this diagnosed? ?This condition is diagnosed based on: ?Your symptoms and your medical and dental history. ?An examination of the infected tooth. During the exam, your dental care provider may tap on the infected tooth. ?You may also need to have X-rays taken of the affected area. ?How is this treated? ?This condition is treated by getting rid of the infection. This may be done with: ?Antibiotic medicines. These may be used in certain situations. ?Antibacterial mouth rinse. ?Incision and drainage. This procedure is done by making an incision in the abscess to drain out the pus. Removing pus is the first priority in treating an abscess. ?A root canal. This may be performed to save the tooth. Your dental care provider accesses the visible part of your tooth (crown) with a drill and removes any infected pulp. Then the space is filled and sealed off. ?Tooth extraction. The tooth is pulled out if it cannot be saved by other treatment. ?You may also receive treatment for pain, such as: ?Acetaminophen or NSAIDs. ?Gels that contain a numbing medicine. ?An injection to block the pain near your nerve. ?Follow these instructions at home: ?Medicines ?Take over-the-counter and prescription medicines only as told by your dental care provider. ?If you were prescribed an antibiotic, take it as told by your dental care provider. Do not stop taking the antibiotic even if you start to feel better. ?If you were prescribed a gel that contains a numbing medicine, use it exactly as told in the directions. Do not use these gels for children who are younger than 28 years of age. ?Use an antibacterial mouth rinse as told by your dental care provider. ?General instructions ? ?Gargle with a mixture of salt and water 3-4 times a  day or as needed. To make salt water, completely dissolve ?-1 tsp (3-6 g) of salt in 1 cup (237 mL) of warm water. ?Eat a soft diet while your abscess is healing. ?Drink enough fluid to keep your urine pale yellow. ?Do not apply heat to the outside of your mouth. ?Do not use any products that contain nicotine or tobacco. These products include cigarettes, chewing tobacco, and vaping devices, such as e-cigarettes. If you need help quitting, ask your dental care provider. ?Keep all follow-up visits. This is important. ?How is this prevented? ? ?Excellent dental home care, which includes brushing your teeth every morning and night with fluoride toothpaste. Floss one time each day. ?Get regularly scheduled dental cleanings. ?Consider having a dental sealant applied on teeth that have deep grooves to prevent cavities. ?Drink fluoridated water regularly. This includes most tap water. Check the label on bottled water to see if it contains fluoride. ?Reduce or eliminate sugary drinks. ?Eat healthy meals and snacks. ?Wear a mouth guard or face shield to protect your teeth while playing sports. ?Contact a health care provider if: ?Your pain is  worse and is not helped by medicine. ?You have swelling. ?You see pus around the tooth. ?You have a fever or chills. ?Get help right away if: ?Your symptoms suddenly get worse. ?You have a very bad headache. ?You have problems breathing or swallowing. ?You have trouble opening your mouth. ?You have swelling in your neck or around your eye. ?These symptoms may represent a serious problem that is an emergency. Do not wait to see if the symptoms will go away. Get medical help right away. Call your local emergency services (911 in the [place] Do not drive yourself to the hospital. ?Summary ?A dental abscess is a collection of pus in or around a tooth that results from an infection. ?A dental abscess may result from severe tooth decay, trauma to the tooth, or severe gum disease around a  tooth. ?Symptoms include severe pain, swelling, redness, and drainage of pus in and around the infected tooth. ?The first priority in treating a dental abscess is to drain out the pus. Treatment may also invo

## 2022-04-05 ENCOUNTER — Telehealth: Payer: Medicaid Other | Admitting: Emergency Medicine

## 2022-04-05 DIAGNOSIS — K047 Periapical abscess without sinus: Secondary | ICD-10-CM

## 2022-04-05 MED ORDER — CHLORHEXIDINE GLUCONATE 0.12 % MT SOLN: 15.0000 mL | Freq: Two times a day (BID) | OROMUCOSAL | 0 refills | Status: DC

## 2022-04-05 MED ORDER — CLINDAMYCIN HCL 300 MG PO CAPS: 300.0000 mg | ORAL_CAPSULE | Freq: Four times a day (QID) | ORAL | 0 refills | Status: DC

## 2022-04-05 NOTE — Patient Instructions (Signed)
  [name], thank you for joining Carvel Getting, NP for today's virtual visit.  While this provider is not your primary care provider (PCP), if your PCP is located in our provider database this encounter information will be shared with them immediately following your visit.   [place] [facility] account gives you access to today's visit and all your visits, tests, and labs performed at [facility] " click here if you don't have a [facility] account or go to [email or web address]  Consent: (Patient) Kelsey Malone provided verbal consent for this virtual visit at the beginning of the encounter.  Current Medications:  Current Outpatient Medications:    chlorhexidine (PERIDEX) 0.12 % solution, Use as directed 15 mLs in the mouth or throat 2 (two) times daily., Disp: 120 mL, Rfl: 0   clindamycin (CLEOCIN) 300 MG capsule, Take 1 capsule (300 mg total) by mouth 4 (four) times daily., Disp: 40 capsule, Rfl: 0   acetaminophen (TYLENOL) 500 MG tablet, Take 1 tablet (500 mg total) by mouth every 6 (six) hours as needed., Disp: 30 tablet, Rfl: 0   sulfamethoxazole-trimethoprim (BACTRIM DS) 800-160 MG tablet, Take 1 tablet by mouth 2 (two) times daily., Disp: 10 tablet, Rfl: 0   Medications ordered in this encounter:  Meds ordered this encounter  Medications   clindamycin (CLEOCIN) 300 MG capsule    Sig: Take 1 capsule (300 mg total) by mouth 4 (four) times daily.    Dispense:  40 capsule    Refill:  0   chlorhexidine (PERIDEX) 0.12 % solution    Sig: Use as directed 15 mLs in the mouth or throat 2 (two) times daily.    Dispense:  120 mL    Refill:  0     *If you need refills on other medications prior to your next appointment, please contact your pharmacy*  Follow-Up: Call back or seek an in-person evaluation if the symptoms worsen or if the condition fails to improve as anticipated.  [place] [phone]  Other Instructions Try  talking with Medicaid or searching online for dental practices that might be friendly for you and call them and see if they are taking new patients.  Finish all the antibiotics as prescribed.   If you have been instructed to have an in-person evaluation today at a local Urgent Care facility, please use the link below. It will take you to a list of all of our available [facility], including address, phone number and hours of operation. Please do not delay care.  [facility]  If you or a family member do not have a primary care provider, use the link below to schedule a visit and establish care. When you choose a [facility] primary care physician or advanced practice provider, you gain a long-term partner in health. Find a Primary Care Provider  Learn more about 's in-office and virtual care options: [facility] Now

## 2022-04-05 NOTE — Progress Notes (Signed)
Virtual Visit Consent   Kelsey Malone, you are scheduled for a virtual visit with a [facility] provider today. Just as with appointments in the office, your consent must be obtained to participate. Your consent will be active for this visit and any virtual visit you may have with one of our providers in the next 365 days. If you have a MyChart account, a copy of this consent can be sent to you electronically.  As this is a virtual visit, video technology does not allow for your provider to perform a traditional examination. This may limit your provider's ability to fully assess your condition. If your provider identifies any concerns that need to be evaluated in person or the need to arrange testing (such as labs, EKG, etc.), we will make arrangements to do so. Although advances in technology are sophisticated, we cannot ensure that it will always work on either your end or our end. If the connection with a video visit is poor, the visit may have to be switched to a telephone visit. With either a video or telephone visit, we are not always able to ensure that we have a secure connection.  By engaging in this virtual visit, you consent to the provision of healthcare and authorize for your insurance to be billed (if applicable) for the services provided during this visit. Depending on your insurance coverage, you may receive a charge related to this service.  I need to obtain your verbal consent now. Are you willing to proceed with your visit today? Kelsey Malone has provided verbal consent on 04/05/2022 for a virtual visit (video or telephone). Carvel Getting, NP  Date: 04/05/2022 11:06 AM  Virtual Visit via Video Note   I, Carvel Getting, connected with  Kelsey Malone  ([id number], 08-12-1965) on 04/05/22 at 11:00 AM EST by a video-enabled telemedicine application and verified that I am speaking with the correct person using two identifiers.  Location: Patient: Virtual Visit Location  Patient: Home Provider: Virtual Visit Location Provider: Home Office   I discussed the limitations of evaluation and management by telemedicine and the availability of in person appointments. The patient expressed understanding and agreed to proceed.    History of Present Illness: Kelsey Malone is a 57 y.o. who identifies as a nonbinary who was assigned adult at birth, and is being seen today for dental abscess.  And reports they have been bothered by a molar on their left bottom jaw for about a week.  They has been taking Tylenol and rinsing with salt water.  In the last day they has developed a very bad taste in their mouth and based on prior experience, they believe they have a dental infection.  They now have insurance and is looking for a dentist and is very excited about being able to get their teeth fixed soon.  Requests an antibiotic and a mouthwash that has worked in the past.  Review of records shows that she has received clindamycin and chlorhexidine oral rinse.  HPI: HPI  Problems: There are no problems to display for this patient.   Allergies:  Allergies  Allergen Reactions   Paregoric Other (See Comments)    hallucinations   Opium Other (See Comments)    Reaction: hallucinations   Penicillins Hives, Nausea And Vomiting and Swelling    Has patient had a PCN reaction causing immediate rash, facial/tongue/throat swelling, SOB or lightheadedness with hypotension: Yes Has patient had a PCN reaction causing severe rash involving mucus  membranes or skin necrosis: No Has patient had a PCN reaction that required hospitalization No Has patient had a PCN reaction occurring within the last 10 years: No If all of the above answers are "NO", then may proceed with Cephalosporin use.    Tramadol Other (See Comments)    "Hallucinations"   Medications:  Current Outpatient Medications:    chlorhexidine (PERIDEX) 0.12 % solution, Use as directed 15 mLs in the mouth or throat 2 (two)  times daily., Disp: 120 mL, Rfl: 0   clindamycin (CLEOCIN) 300 MG capsule, Take 1 capsule (300 mg total) by mouth 4 (four) times daily., Disp: 40 capsule, Rfl: 0   acetaminophen (TYLENOL) 500 MG tablet, Take 1 tablet (500 mg total) by mouth every 6 (six) hours as needed., Disp: 30 tablet, Rfl: 0   sulfamethoxazole-trimethoprim (BACTRIM DS) 800-160 MG tablet, Take 1 tablet by mouth 2 (two) times daily., Disp: 10 tablet, Rfl: 0  Observations/Objective: Patient is well-developed, well-nourished in no acute distress.  Resting comfortably  at home.  Head is normocephalic, atraumatic.  No labored breathing.  Speech is clear and coherent with logical content.  Patient is alert and oriented at baseline.    Assessment and Plan: 1. Dental infection  Patient will contact her insurance company to get guidance on where she can seek dental care.  She will continue Tylenol and salt water rinses until her infection is cleared up in addition to the clindamycin and chlorhexidine  Follow Up Instructions: I discussed the assessment and treatment plan with the patient. The patient was provided an opportunity to ask questions and all were answered. The patient agreed with the plan and demonstrated an understanding of the instructions.  A copy of instructions were sent to the patient via MyChart unless otherwise noted below.   The patient was advised to call back or seek an in-person evaluation if the symptoms worsen or if the condition fails to improve as anticipated.  Time:  I spent 12 minutes with the patient via telehealth technology discussing the above problems/concerns.    Carvel Getting, NP

## 2022-07-22 ENCOUNTER — Telehealth: Payer: Medicaid Other | Admitting: Family Medicine

## 2022-07-22 DIAGNOSIS — K219 Gastro-esophageal reflux disease without esophagitis: Secondary | ICD-10-CM | POA: Diagnosis not present

## 2022-07-22 MED ORDER — PANTOPRAZOLE SODIUM 20 MG PO TBEC: 20.0000 mg | DELAYED_RELEASE_TABLET | Freq: Every day | ORAL | 0 refills | Status: AC

## 2022-07-22 NOTE — Patient Instructions (Addendum)
Velora Mediate, thank you for joining Freddy Finner, NP for today's virtual visit.  While this provider is not your primary care provider (PCP), if your PCP is located in our provider database this encounter information will be shared with them immediately following your visit.   A [facility] MyChart account gives you access to today's visit and all your visits, tests, and labs performed at [facility] " click here if you don't have a [facility] MyChart account or go to [email or web address]  Consent: (Patient) Kelsey Malone provided verbal consent for this virtual visit at the beginning of the encounter.  Current Medications:  Current Outpatient Medications:    pantoprazole (PROTONIX) 20 MG tablet, Take 1 tablet (20 mg total) by mouth daily., Disp: 90 tablet, Rfl: 0   Medications ordered in this encounter:  Meds ordered this encounter  Medications   pantoprazole (PROTONIX) 20 MG tablet    Sig: Take 1 tablet (20 mg total) by mouth daily.    Dispense:  90 tablet    Refill:  0    Order Specific Question:   Supervising Provider    Answer:   Merrilee Jansky [id number]     *If you need refills on other medications prior to your next appointment, please contact your pharmacy*  Follow-Up: Call back or seek an in-person evaluation if the symptoms worsen or if the condition fails to improve as anticipated.  [facility] [phone]  Other Instructions  Food Choices for Gastroesophageal Reflux Disease, Adult When you have gastroesophageal reflux disease (GERD), the foods you eat and your eating habits are very important. Choosing the right foods can help ease your discomfort. Think about working with a food expert (dietitian) to help you make good choices. What are tips for following this plan? Reading food labels Look for foods that are low in saturated fat. Foods that may help with your symptoms include: Foods that have less than 5% of daily  value (DV) of fat. Foods that have 0 grams of trans fat. Cooking Do not fry your food. Cook your food by baking, steaming, grilling, or broiling. These are all methods that do not need a lot of fat for cooking. To add flavor, try to use herbs that are low in spice and acidity. Meal planning  Choose healthy foods that are low in fat, such as: Fruits and vegetables. Whole grains. Low-fat dairy products. Lean meats, fish, and poultry. Eat small meals often instead of eating 3 large meals each day. Eat your meals slowly in a place where you are relaxed. Avoid bending over or lying down until 2-3 hours after eating. Limit high-fat foods such as fatty meats or fried foods. Limit your intake of fatty foods, such as oils, butter, and shortening. Avoid the following as told by your doctor: Foods that cause symptoms. These may be different for different people. Keep a food diary to keep track of foods that cause symptoms. Alcohol. Drinking a lot of liquid with meals. Eating meals during the 2-3 hours before bed. Lifestyle Stay at a healthy weight. Ask your doctor what weight is healthy for you. If you need to lose weight, work with your doctor to do so safely. Exercise for at least 30 minutes on 5 or more days each week, or as told by your doctor. Wear loose-fitting clothes. Do not smoke or use any products that contain nicotine or tobacco. If you need help quitting, ask your doctor. Sleep with the head of your  bed higher than your feet. Use a wedge under the mattress or blocks under the bed frame to raise the head of the bed. Chew sugar-free gum after meals. What foods should eat?  Eat a healthy, well-balanced diet of fruits, vegetables, whole grains, low-fat dairy products, lean meats, fish, and poultry. Each person is different. Foods that may cause symptoms in one person may not cause any symptoms in another person. Work with your doctor to find foods that are safe for you. The items listed  above may not be a complete list of what you can eat and drink. Contact a food expert for more options. What foods should I avoid? Limiting some of these foods may help in managing the symptoms of GERD. Everyone is different. Talk with a food expert or your doctor to help you find the exact foods to avoid, if any. Fruits Any fruits prepared with added fat. Any fruits that cause symptoms. For some people, this may include citrus fruits, such as oranges, grapefruit, pineapple, and lemons. Vegetables Deep-fried vegetables. [place] fries. Any vegetables prepared with added fat. Any vegetables that cause symptoms. For some people, this may include tomatoes and tomato products, chili peppers, onions and garlic, and horseradish. Grains Pastries or quick breads with added fat. Meats and other proteins High-fat meats, such as fatty beef or pork, hot dogs, ribs, ham, sausage, salami, and bacon. Fried meat or protein, including fried fish and fried chicken. Nuts and nut butters, in large amounts. Dairy Whole milk and chocolate milk. Sour cream. Cream. Ice cream. Cream cheese. Milkshakes. Fats and oils Butter. Margarine. Shortening. Ghee. Beverages Coffee and tea, with or without caffeine. Carbonated beverages. Sodas. Energy drinks. Fruit juice made with acidic fruits, such as orange or grapefruit. Tomato juice. Alcoholic drinks. Sweets and desserts Chocolate and cocoa. Donuts. Seasonings and condiments Pepper. Peppermint and spearmint. Added salt. Any condiments, herbs, or seasonings that cause symptoms. For some people, this may include curry, hot sauce, or vinegar-based salad dressings. The items listed above may not be a complete list of what you should not eat and drink. Contact a food expert for more options. Questions to ask your doctor Diet and lifestyle changes are often the first steps that are taken to manage symptoms of GERD. If diet and lifestyle changes do not help, talk with your doctor  about taking medicines. Where to find more information International Foundation for Gastrointestinal Disorders: aboutgerd.org Summary When you have GERD, food and lifestyle choices are very important in easing your symptoms. Eat small meals often instead of 3 large meals a day. Eat your meals slowly and in a place where you are relaxed. Avoid bending over or lying down until 2-3 hours after eating. Limit high-fat foods such as fatty meats or fried foods. This information is not intended to replace advice given to you by your health care provider. Make sure you discuss any questions you have with your health care provider. Document Revised: 08/19/2019 Document Reviewed: 08/19/2019 Elsevier Patient Education  2024 Elsevier Inc.   If you have been instructed to have an in-person evaluation today at a local Urgent Care facility, please use the link below. It will take you to a list of all of our available [facility], including address, phone number and hours of operation. Please do not delay care.  [facility]  If you or a family member do not have a primary care provider, use the link below to schedule a visit and establish care. When  you choose a [facility] primary care physician or advanced practice provider, you gain a long-term partner in health. Find a Primary Care Provider  Learn more about [facility]'s in-office and virtual care options: [facility] - Get Care Now

## 2022-07-22 NOTE — Progress Notes (Signed)
Virtual Visit Consent   Kelsey Malone, you are scheduled for a virtual visit with a [facility] provider today. Just as with appointments in the office, your consent must be obtained to participate. Your consent will be active for this visit and any virtual visit you may have with one of our providers in the next 365 days. If you have a MyChart account, a copy of this consent can be sent to you electronically.  As this is a virtual visit, video technology does not allow for your provider to perform a traditional examination. This may limit your provider's ability to fully assess your condition. If your provider identifies any concerns that need to be evaluated in person or the need to arrange testing (such as labs, EKG, etc.), we will make arrangements to do so. Although advances in technology are sophisticated, we cannot ensure that it will always work on either your end or our end. If the connection with a video visit is poor, the visit may have to be switched to a telephone visit. With either a video or telephone visit, we are not always able to ensure that we have a secure connection.  By engaging in this virtual visit, you consent to the provision of healthcare and authorize for your insurance to be billed (if applicable) for the services provided during this visit. Depending on your insurance coverage, you may receive a charge related to this service.  I need to obtain your verbal consent now. Are you willing to proceed with your visit today? Kelsey Malone has provided verbal consent on 07/22/2022 for a virtual visit (video or telephone). Kelsey Finner, NP  Date: 07/22/2022 3:02 PM  Virtual Visit via Video Note   I, Kelsey Malone, connected with  Kelsey Malone  ([id number], April 07, 1965) on 07/22/22 at  3:00 PM EDT by a video-enabled telemedicine application and verified that I am speaking with the correct person using two identifiers.  Location: Patient: Virtual Visit Location  Patient: Home Provider: Virtual Visit Location Provider: Home Office   I discussed the limitations of evaluation and management by telemedicine and the availability of in person appointments. The patient expressed understanding and agreed to proceed.    History of Present Illness: Kelsey Malone is a 57 y.o. who identifies as a nonbinary who was assigned adult at birth, and is being seen today for stomach pain. Has stopped smoking in last year or so. Onset was yesterday started getting worse sensations of reflux- reports eating a lot of [place] and coffee- but now having  Associated symptoms are hiccups, nausea, vomiting - most foods can't tolerate, no blood in vomit, feels the warm sensation in chest after belching or hiccups  Modifying factors are backing soda in water, Denies chest pain, shortness of breath, fevers, chills No changes in bladder and bowel habits.  Problems: There are no problems to display for this patient.   Allergies:  Allergies  Allergen Reactions   Paregoric Other (See Comments)    hallucinations   Opium Other (See Comments)    Reaction: hallucinations   Penicillins Hives, Nausea And Vomiting and Swelling    Has patient had a PCN reaction causing immediate rash, facial/tongue/throat swelling, SOB or lightheadedness with hypotension: Yes Has patient had a PCN reaction causing severe rash involving mucus membranes or skin necrosis: No Has patient had a PCN reaction that required hospitalization No Has patient had a PCN reaction occurring within the last 10 years: No If all of the above  answers are "NO", then may proceed with Cephalosporin use.    Tramadol Other (See Comments)    "Hallucinations"   Medications: No current outpatient medications on file.  Observations/Objective: Patient is well-developed, well-nourished in no acute distress.  Resting comfortably  at home.  Head is normocephalic, atraumatic.  No labored breathing.  Speech is clear and  coherent with logical content.  Patient is alert and oriented at baseline.    Assessment and Plan:  1. Gastroesophageal reflux disease, unspecified whether esophagitis present   - pantoprazole (PROTONIX) 20 MG tablet; Take 1 tablet (20 mg total) by mouth daily.  Dispense: 90 tablet; Refill: 0  -antiacids OTC as needed -needs to be followed for on going needs  -bland diet- and changes to current diet -info on AVS   Reviewed side effects, risks and benefits of medication.    Patient acknowledged agreement and understanding of the plan.   Past Medical, Surgical, Social History, Allergies, and Medications have been Reviewed.    Follow Up Instructions: I discussed the assessment and treatment plan with the patient. The patient was provided an opportunity to ask questions and all were answered. The patient agreed with the plan and demonstrated an understanding of the instructions.  A copy of instructions were sent to the patient via MyChart unless otherwise noted below.     The patient was advised to call back or seek an in-person evaluation if the symptoms worsen or if the condition fails to improve as anticipated.  Time:  I spent 10 minutes with the patient via telehealth technology discussing the above problems/concerns.    Kelsey Finner, NP

## 2022-08-26 ENCOUNTER — Other Ambulatory Visit: Payer: Self-pay | Admitting: Internal Medicine

## 2022-08-26 DIAGNOSIS — M1610 Unilateral primary osteoarthritis, unspecified hip: Secondary | ICD-10-CM

## 2022-08-26 DIAGNOSIS — M5136 Other intervertebral disc degeneration, lumbar region: Secondary | ICD-10-CM

## 2022-08-26 DIAGNOSIS — M797 Fibromyalgia: Secondary | ICD-10-CM

## 2022-08-26 DIAGNOSIS — M719 Bursopathy, unspecified: Secondary | ICD-10-CM

## 2022-09-13 ENCOUNTER — Ambulatory Visit
Admission: RE | Admit: 2022-09-13 | Discharge: 2022-09-13 | Disposition: A | Discharge status: Home / Self Care | Payer: Disability Insurance | Source: Ambulatory Visit | Attending: Internal Medicine | Admitting: Internal Medicine

## 2022-09-13 ENCOUNTER — Ambulatory Visit
Admission: RE | Admit: 2022-09-13 | Discharge: 2022-09-13 | Disposition: A | Discharge status: Home / Self Care | Payer: Disability Insurance | Attending: Internal Medicine | Admitting: Internal Medicine

## 2022-09-13 DIAGNOSIS — M5136 Other intervertebral disc degeneration, lumbar region: Secondary | ICD-10-CM | POA: Diagnosis present

## 2022-09-13 DIAGNOSIS — M1610 Unilateral primary osteoarthritis, unspecified hip: Secondary | ICD-10-CM | POA: Diagnosis present

## 2022-09-13 DIAGNOSIS — M719 Bursopathy, unspecified: Secondary | ICD-10-CM | POA: Diagnosis present

## 2022-09-13 DIAGNOSIS — M797 Fibromyalgia: Secondary | ICD-10-CM | POA: Diagnosis present

## 2022-12-26 ENCOUNTER — Telehealth: Payer: Medicaid Other | Admitting: Physician Assistant

## 2022-12-26 DIAGNOSIS — K047 Periapical abscess without sinus: Secondary | ICD-10-CM

## 2022-12-26 MED ORDER — CHLORHEXIDINE GLUCONATE 0.12 % MT SOLN: 15.0000 mL | Freq: Two times a day (BID) | OROMUCOSAL | 0 refills | Status: AC

## 2022-12-26 MED ORDER — CLINDAMYCIN HCL 300 MG PO CAPS: 300.0000 mg | ORAL_CAPSULE | Freq: Four times a day (QID) | ORAL | 0 refills | Status: AC

## 2022-12-26 NOTE — Patient Instructions (Signed)
Velora Mediate, thank you for joining Margaretann Loveless, PA-C for today's virtual visit.  While this provider is not your primary care provider (PCP), if your PCP is located in our provider database this encounter information will be shared with them immediately following your visit.   A [facility] MyChart account gives you access to today's visit and all your visits, tests, and labs performed at [facility] " click here if you don't have a [facility] MyChart account or go to [email or web address]  Consent: (Patient) Kelsey Malone provided verbal consent for this virtual visit at the beginning of the encounter.  Current Medications:  Current Outpatient Medications:    chlorhexidine (PERIDEX) 0.12 % solution, Use as directed 15 mLs in the mouth or throat 2 (two) times daily., Disp: 120 mL, Rfl: 0   clindamycin (CLEOCIN) 300 MG capsule, Take 1 capsule (300 mg total) by mouth 4 (four) times daily., Disp: 40 capsule, Rfl: 0   pantoprazole (PROTONIX) 20 MG tablet, Take 1 tablet (20 mg total) by mouth daily., Disp: 90 tablet, Rfl: 0   Medications ordered in this encounter:  Meds ordered this encounter  Medications   clindamycin (CLEOCIN) 300 MG capsule    Sig: Take 1 capsule (300 mg total) by mouth 4 (four) times daily.    Dispense:  40 capsule    Refill:  0    Order Specific Question:   Supervising Provider    Answer:   Merrilee Jansky [id number]   chlorhexidine (PERIDEX) 0.12 % solution    Sig: Use as directed 15 mLs in the mouth or throat 2 (two) times daily.    Dispense:  120 mL    Refill:  0    Order Specific Question:   Supervising Provider    Answer:   Merrilee Jansky [[id number]]     *If you need refills on other medications prior to your next appointment, please contact your pharmacy*  Follow-Up: Call back or seek an in-person evaluation if the symptoms worsen or if the condition fails to improve as anticipated.  [facility] [phone]  Other Instructions Dental Abscess  A dental abscess is an infection around a tooth that may involve pain, swelling, and a collection of pus, as well as other symptoms. Treatment is important to help with symptoms and to prevent the infection from spreading. The general types of dental abscesses are: Pulpal abscess. This abscess may form from the inner part of the tooth (pulp). Periodontal abscess. This abscess may form from the gum. What are the causes? This condition is caused by a bacterial infection in or around the tooth. It may result from: Severe tooth decay (cavities). Trauma to the tooth, such as a broken or chipped tooth. What increases the risk? This condition is more likely to develop in males. It is also more likely to develop in people who: Have cavities. Have severe gum disease. Eat sugary snacks between meals. Use tobacco products. Have diabetes. Have a weakened disease-fighting system (immune system). Do not brush and care for their teeth regularly. What are the signs or symptoms? Mild symptoms of this condition include: Tenderness. Bad breath. Fever. A bitter taste in the mouth. Pain in and around the infected tooth. Moderate symptoms of this condition include: Swollen neck glands. Chills. Pus drainage. Swelling and redness around the infected tooth, in the mouth, or in the face. Severe pain in and around the infected tooth. Severe symptoms of this condition include: Difficulty swallowing. Difficulty  opening the mouth. Nausea. Vomiting. How is this diagnosed? This condition is diagnosed based on: Your symptoms and your medical and dental history. An examination of the infected tooth. During the exam, your dental care provider may tap on the infected tooth. You may also need to have X-rays taken of the affected area. How is this treated? This condition is treated by getting rid of the infection. This may be done with: Antibiotic medicines.  These may be used in certain situations. Antibacterial mouth rinse. Incision and drainage. This procedure is done by making an incision in the abscess to drain out the pus. Removing pus is the first priority in treating an abscess. A root canal. This may be performed to save the tooth. Your dental care provider accesses the visible part of your tooth (crown) with a drill and removes any infected pulp. Then the space is filled and sealed off. Tooth extraction. The tooth is pulled out if it cannot be saved by other treatment. You may also receive treatment for pain, such as: Acetaminophen or NSAIDs. Gels that contain a numbing medicine. An injection to block the pain near your nerve. Follow these instructions at home: Medicines Take over-the-counter and prescription medicines only as told by your dental care provider. If you were prescribed an antibiotic, take it as told by your dental care provider. Do not stop taking the antibiotic even if you start to feel better. If you were prescribed a gel that contains a numbing medicine, use it exactly as told in the directions. Do not use these gels for children who are younger than 29 years of age. Use an antibacterial mouth rinse as told by your dental care provider. General instructions  Gargle with a mixture of salt and water 3-4 times a day or as needed. To make salt water, completely dissolve -1 tsp (3-6 g) of salt in 1 cup (237 mL) of warm water. Eat a soft diet while your abscess is healing. Drink enough fluid to keep your urine pale yellow. Do not apply heat to the outside of your mouth. Do not use any products that contain nicotine or tobacco. These products include cigarettes, chewing tobacco, and vaping devices, such as e-cigarettes. If you need help quitting, ask your dental care provider. Keep all follow-up visits. This is important. How is this prevented?  Excellent dental home care, which includes brushing your teeth every morning and  night with fluoride toothpaste. Floss one time each day. Get regularly scheduled dental cleanings. Consider having a dental sealant applied on teeth that have deep grooves to prevent cavities. Drink fluoridated water regularly. This includes most tap water. Check the label on bottled water to see if it contains fluoride. Reduce or eliminate sugary drinks. Eat healthy meals and snacks. Wear a mouth guard or face shield to protect your teeth while playing sports. Contact a health care provider if: Your pain is worse and is not helped by medicine. You have swelling. You see pus around the tooth. You have a fever or chills. Get help right away if: Your symptoms suddenly get worse. You have a very bad headache. You have problems breathing or swallowing. You have trouble opening your mouth. You have swelling in your neck or around your eye. These symptoms may represent a serious problem that is an emergency. Do not wait to see if the symptoms will go away. Get medical help right away. Call your local emergency services (911 in the [place] Do not drive yourself to the hospital. Summary A  dental abscess is a collection of pus in or around a tooth that results from an infection. A dental abscess may result from severe tooth decay, trauma to the tooth, or severe gum disease around a tooth. Symptoms include severe pain, swelling, redness, and drainage of pus in and around the infected tooth. The first priority in treating a dental abscess is to drain out the pus. Treatment may also involve removing damage inside the tooth (root canal) or extracting the tooth. This information is not intended to replace advice given to you by your health care provider. Make sure you discuss any questions you have with your health care provider. Document Revised: 04/16/2020 Document Reviewed: 04/16/2020 Elsevier Patient Education  2024 Elsevier Inc.    If you have been instructed to have an in-person evaluation  today at a local Urgent Care facility, please use the link below. It will take you to a list of all of our available [facility], including address, phone number and hours of operation. Please do not delay care.  [facility]  If you or a family member do not have a primary care provider, use the link below to schedule a visit and establish care. When you choose a [facility] primary care physician or advanced practice provider, you gain a long-term partner in health. Find a Primary Care Provider  Learn more about [facility]'s in-office and virtual care options: [place] - Get Care Now

## 2022-12-26 NOTE — Progress Notes (Signed)
Virtual Visit Consent   [name], you are scheduled for a virtual visit with a [facility] provider today. Just as with appointments in the office, your consent must be obtained to participate. Your consent will be active for this visit and any virtual visit you may have with one of our providers in the next 365 days. If you have a MyChart account, a copy of this consent can be sent to you electronically.  As this is a virtual visit, video technology does not allow for your provider to perform a traditional examination. This may limit your provider's ability to fully assess your condition. If your provider identifies any concerns that need to be evaluated in person or the need to arrange testing (such as labs, EKG, etc.), we will make arrangements to do so. Although advances in technology are sophisticated, we cannot ensure that it will always work on either your end or our end. If the connection with a video visit is poor, the visit may have to be switched to a telephone visit. With either a video or telephone visit, we are not always able to ensure that we have a secure connection.  By engaging in this virtual visit, you consent to the provision of healthcare and authorize for your insurance to be billed (if applicable) for the services provided during this visit. Depending on your insurance coverage, you may receive a charge related to this service.  I need to obtain your verbal consent now. Are you willing to proceed with your visit today? Kelsey Malone has provided verbal consent on 12/26/2022 for a virtual visit (video or telephone). Margaretann Loveless, PA-C  Date: 12/26/2022 8:01 AM  Virtual Visit via Video Note   I, Margaretann Loveless, connected with  Kelsey Malone  ([id number], 04-17-1965) on 12/26/22 at  8:15 AM EST by a video-enabled telemedicine application and verified that I am speaking with the correct person using two identifiers.  Location: Patient: Virtual  Visit Location Patient: Home Provider: Virtual Visit Location Provider: Home Office   I discussed the limitations of evaluation and management by telemedicine and the availability of in person appointments. The patient expressed understanding and agreed to proceed.    History of Present Illness: Kelsey Malone is a 57 y.o. who identifies as a nonbinary who was assigned adult at birth, and is being seen today for a dental infection.  HPI: Dental Pain  This is a new (last was 03/2022) problem. The current episode started in the past 7 days. The problem occurs constantly. The problem has been gradually worsening. The pain is moderate. Associated symptoms include facial pain, sinus pressure and thermal sensitivity. Pertinent negatives include no difficulty swallowing or fever. Treatments tried: benadryl, salt water gargles, ibuprofen. The treatment provided no relief.    Problems: There are no problems to display for this patient.   Allergies:  Allergies  Allergen Reactions   Paregoric Other (See Comments)    hallucinations   Opium Other (See Comments)    Reaction: hallucinations   Penicillins Hives, Nausea And Vomiting and Swelling    Has patient had a PCN reaction causing immediate rash, facial/tongue/throat swelling, SOB or lightheadedness with hypotension: Yes Has patient had a PCN reaction causing severe rash involving mucus membranes or skin necrosis: No Has patient had a PCN reaction that required hospitalization No Has patient had a PCN reaction occurring within the last 10 years: No If all of the above answers are "NO", then may proceed with Cephalosporin  use.    Tramadol Other (See Comments)    "Hallucinations"   Medications:  Current Outpatient Medications:    chlorhexidine (PERIDEX) 0.12 % solution, Use as directed 15 mLs in the mouth or throat 2 (two) times daily., Disp: 120 mL, Rfl: 0   clindamycin (CLEOCIN) 300 MG capsule, Take 1 capsule (300 mg total) by mouth 4  (four) times daily., Disp: 40 capsule, Rfl: 0   pantoprazole (PROTONIX) 20 MG tablet, Take 1 tablet (20 mg total) by mouth daily., Disp: 90 tablet, Rfl: 0  Observations/Objective: Patient is well-developed, well-nourished in no acute distress.  Resting comfortably at home.  Head is normocephalic, atraumatic.  No labored breathing.  Speech is clear and coherent with logical content.  Patient is alert and oriented at baseline.    Assessment and Plan: 1. Dental infection - clindamycin (CLEOCIN) 300 MG capsule; Take 1 capsule (300 mg total) by mouth 4 (four) times daily.  Dispense: 40 capsule; Refill: 0 - chlorhexidine (PERIDEX) 0.12 % solution; Use as directed 15 mLs in the mouth or throat 2 (two) times daily.  Dispense: 120 mL; Refill: 0  - Suspected infection with broken tooth - Clindamycin prescribed - Chlorhexidine mouth rinse - Can use ice on outside jaw/cheek for swelling - Can also take tylenol for pain with other medications - Discussed DenTemp putty that can be used to cover a broken tooth - Schedule a follow with a dentist as soon as possible (Can contact [facility] if underinsured or uninsured) - Seek in person evaluation if symptoms fail to improve or if they worsen   Follow Up Instructions: I discussed the assessment and treatment plan with the patient. The patient was provided an opportunity to ask questions and all were answered. The patient agreed with the plan and demonstrated an understanding of the instructions.  A copy of instructions were sent to the patient via MyChart unless otherwise noted below.    The patient was advised to call back or seek an in-person evaluation if the symptoms worsen or if the condition fails to improve as anticipated.    Margaretann Loveless, PA-C

## 2023-05-25 ENCOUNTER — Telehealth
